# Patient Record
Sex: Female | Born: 1975 | Race: Black or African American | Hispanic: No | Marital: Single | Smoking: Current every day smoker
Health system: Southern US, Community
[De-identification: ages and names within clinical notes are randomized; demographics above are authoritative.]

## PROBLEM LIST (undated history)

## (undated) DIAGNOSIS — I10 Essential (primary) hypertension: Secondary | ICD-10-CM

## (undated) DIAGNOSIS — F4310 Post-traumatic stress disorder, unspecified: Secondary | ICD-10-CM

## (undated) DIAGNOSIS — E669 Obesity, unspecified: Secondary | ICD-10-CM

## (undated) DIAGNOSIS — D179 Benign lipomatous neoplasm, unspecified: Secondary | ICD-10-CM

## (undated) DIAGNOSIS — T8859XA Other complications of anesthesia, initial encounter: Secondary | ICD-10-CM

## (undated) DIAGNOSIS — D649 Anemia, unspecified: Secondary | ICD-10-CM

## (undated) DIAGNOSIS — T4145XA Adverse effect of unspecified anesthetic, initial encounter: Secondary | ICD-10-CM

## (undated) DIAGNOSIS — Z9289 Personal history of other medical treatment: Secondary | ICD-10-CM

## (undated) DIAGNOSIS — J40 Bronchitis, not specified as acute or chronic: Secondary | ICD-10-CM

## (undated) HISTORY — PX: COLON SURGERY: SHX602

## (undated) HISTORY — PX: APPENDECTOMY: SHX54

## (undated) HISTORY — PX: KNEE SURGERY: SHX244

## (undated) HISTORY — DX: Essential (primary) hypertension: I10

---

## 2014-02-05 ENCOUNTER — Encounter (HOSPITAL_COMMUNITY): Payer: Self-pay | Admitting: Emergency Medicine

## 2014-02-05 ENCOUNTER — Emergency Department (HOSPITAL_COMMUNITY)
Admission: EM | Admit: 2014-02-05 | Discharge: 2014-02-05 | Disposition: A | Discharge status: Home / Self Care | Payer: Medicare Other | Attending: Emergency Medicine | Admitting: Emergency Medicine

## 2014-02-05 ENCOUNTER — Emergency Department (HOSPITAL_COMMUNITY): Payer: Medicare Other

## 2014-02-05 DIAGNOSIS — Y929 Unspecified place or not applicable: Secondary | ICD-10-CM | POA: Insufficient documentation

## 2014-02-05 DIAGNOSIS — S99919A Unspecified injury of unspecified ankle, initial encounter: Principal | ICD-10-CM

## 2014-02-05 DIAGNOSIS — S8990XA Unspecified injury of unspecified lower leg, initial encounter: Secondary | ICD-10-CM | POA: Insufficient documentation

## 2014-02-05 DIAGNOSIS — F17200 Nicotine dependence, unspecified, uncomplicated: Secondary | ICD-10-CM | POA: Diagnosis not present

## 2014-02-05 DIAGNOSIS — X500XXA Overexertion from strenuous movement or load, initial encounter: Secondary | ICD-10-CM | POA: Insufficient documentation

## 2014-02-05 DIAGNOSIS — W108XXA Fall (on) (from) other stairs and steps, initial encounter: Secondary | ICD-10-CM | POA: Insufficient documentation

## 2014-02-05 DIAGNOSIS — Y9389 Activity, other specified: Secondary | ICD-10-CM | POA: Diagnosis not present

## 2014-02-05 DIAGNOSIS — M25571 Pain in right ankle and joints of right foot: Secondary | ICD-10-CM

## 2014-02-05 DIAGNOSIS — Z8659 Personal history of other mental and behavioral disorders: Secondary | ICD-10-CM | POA: Diagnosis not present

## 2014-02-05 DIAGNOSIS — S99929A Unspecified injury of unspecified foot, initial encounter: Principal | ICD-10-CM

## 2014-02-05 HISTORY — DX: Post-traumatic stress disorder, unspecified: F43.10

## 2014-02-05 MED ORDER — DICLOFENAC 35 MG PO CAPS: 1.0000 | ORAL_CAPSULE | Freq: Three times a day (TID) | ORAL | Status: DC | PRN

## 2014-02-05 MED ORDER — DICLOFENAC SODIUM 75 MG PO TBEC
75.0000 mg | DELAYED_RELEASE_TABLET | Freq: Once | ORAL | Status: AC
  Administered 2014-02-05: 75 mg via ORAL
  Filled 2014-02-05: qty 1

## 2014-02-05 MED ORDER — ACETAMINOPHEN-CODEINE #3 300-30 MG PO TABS: 1.0000 | ORAL_TABLET | Freq: Four times a day (QID) | ORAL | Status: DC | PRN

## 2014-02-05 NOTE — ED Notes (Signed)
Pt presents with pain and swelling to Right foot. Pt states she fell down her stairs and her foot went backwards

## 2014-02-05 NOTE — Progress Notes (Signed)
Orthopedic Tech Progress Note Patient Details:  Betty Perez 1976-10-14 [id number]  Ortho Devices Type of Ortho Device: Ace wrap;Crutches Ortho Device/Splint Location: RLE Ortho Device/Splint Interventions: Ordered;Application   Braulio Bosch 02/05/2014, 9:14 PM

## 2014-02-05 NOTE — ED Notes (Signed)
Ortho at bedside.

## 2014-02-05 NOTE — ED Notes (Signed)
Patient transported to X-ray 

## 2014-02-05 NOTE — Discharge Instructions (Signed)
Please follow up with your primary care physician in 1-2 days. If you do not have one please call the [facility] number listed above.Please follow up with the orthopedist, Dr. Percell Miller to schedule a follow up appointment. Please take Voltaren as prescribed. Please follow RICE method below. Please read all discharge instructions and return precautions.    Ankle Pain Ankle pain is a common symptom. The bones, cartilage, tendons, and muscles of the ankle joint perform a lot of work each day. The ankle joint holds your body weight and allows you to move around. Ankle pain can occur on either side or back of 1 or both ankles. Ankle pain may be sharp and burning or dull and aching. There may be tenderness, stiffness, redness, or warmth around the ankle. The pain occurs more often when a person walks or puts pressure on the ankle. CAUSES  There are many reasons ankle pain can develop. It is important to work with your caregiver to identify the cause since many conditions can impact the bones, cartilage, muscles, and tendons. Causes for ankle pain include:  Injury, including a break (fracture), sprain, or strain often due to a fall, sports, or a high-impact activity.  Swelling (inflammation) of a tendon (tendonitis).  Achilles tendon rupture.  Ankle instability after repeated sprains and strains.  Poor foot alignment.  Pressure on a nerve (tarsal tunnel syndrome).  Arthritis in the ankle or the lining of the ankle.  Crystal formation in the ankle (gout or pseudogout). DIAGNOSIS  A diagnosis is based on your medical history, your symptoms, results of your physical exam, and results of diagnostic tests. Diagnostic tests may include X-ray exams or a computerized magnetic scan (magnetic resonance imaging, MRI). TREATMENT  Treatment will depend on the cause of your ankle pain and may include:  Keeping pressure off the ankle and limiting activities.  Using crutches or other walking  support (a cane or brace).  Using rest, ice, compression, and elevation.  Participating in physical therapy or home exercises.  Wearing shoe inserts or special shoes.  Losing weight.  Taking medications to reduce pain or swelling or receiving an injection.  Undergoing surgery. HOME CARE INSTRUCTIONS   Only take over-the-counter or prescription medicines for pain, discomfort, or fever as directed by your caregiver.  Put ice on the injured area.  Put ice in a plastic bag.  Place a towel between your skin and the bag.  Leave the ice on for 15-20 minutes at a time, 03-04 times a day.  Keep your leg raised (elevated) when possible to lessen swelling.  Avoid activities that cause ankle pain.  Follow specific exercises as directed by your caregiver.  Record how often you have ankle pain, the location of the pain, and what it feels like. This information may be helpful to you and your caregiver.  Ask your caregiver about returning to work or sports and whether you should drive.  Follow up with your caregiver for further examination, therapy, or testing as directed. SEEK MEDICAL CARE IF:   Pain or swelling continues or worsens beyond 1 week.  You have an oral temperature above 102 F (38.9 C).  You are feeling unwell or have chills.  You are having an increasingly difficult time with walking.  You have loss of sensation or other new symptoms.  You have questions or concerns. MAKE SURE YOU:   Understand these instructions.  Will watch your condition.  Will get help right away if you are not doing  well or get worse. Document Released: 05/05/2010 Document Revised: 02/07/2012 Document Reviewed: 05/05/2010 [facility] Patient Information 2014 [place]. RICE: Routine Care for Injuries The routine care of many injuries includes Rest, Ice, Compression, and Elevation (RICE). HOME CARE INSTRUCTIONS  Rest is needed to allow your body to heal. Routine activities can  usually be resumed when comfortable. Injured tendons and bones can take up to 6 weeks to heal. Tendons are the cord-like structures that attach muscle to bone.  Ice following an injury helps keep the swelling down and reduces pain.  Put ice in a plastic bag.  Place a towel between your skin and the bag.  Leave the ice on for 15-20 minutes, 03-04 times a day. Do this while awake, for the first 24 to 48 hours. After that, continue as directed by your caregiver.  Compression helps keep swelling down. It also gives support and helps with discomfort. If an elastic bandage has been applied, it should be removed and reapplied every 3 to 4 hours. It should not be applied tightly, but firmly enough to keep swelling down. Watch fingers or toes for swelling, bluish discoloration, coldness, numbness, or excessive pain. If any of these problems occur, remove the bandage and reapply loosely. Contact your caregiver if these problems continue.  Elevation helps reduce swelling and decreases pain. With extremities, such as the arms, hands, legs, and feet, the injured area should be placed near or above the level of the heart, if possible. SEEK IMMEDIATE MEDICAL CARE IF:  You have persistent pain and swelling.  You develop redness, numbness, or unexpected weakness.  Your symptoms are getting worse rather than improving after several days. These symptoms may indicate that further evaluation or further X-rays are needed. Sometimes, X-rays may not show a small broken bone (fracture) until 1 week or 10 days later. Make a follow-up appointment with your caregiver. Ask when your X-ray results will be ready. Make sure you get your X-ray results. Document Released: 02/27/2001 Document Revised: 02/07/2012 Document Reviewed: 04/16/2011 [facility] Patient Information 2014 [place], [place].

## 2014-02-05 NOTE — ED Notes (Signed)
Pt returned from xray

## 2014-02-05 NOTE — ED Provider Notes (Signed)
CSN: [id number]     Arrival date & time 02/05/14  1848 History   First MD Initiated Contact with Patient 02/05/14 1956 This chart was scribed for non-physician practitioner Baron Sane, PA-C working with Juanda Crumble B. Karle Starch, MD by Anastasia Pall, ED scribe. This patient was seen in room TR11C/TR11C and the patient's care was started at 8:35 PM.     Chief Complaint  Patient presents with  . Foot Injury   (Consider location/radiation/quality/duration/timing/severity/associated sxs/prior Treatment) The history is provided by the patient. No language interpreter was used.   HPI Comments: Betty Perez is a 38 y.o. female who presents to the Emergency Department complaining of constant, sharp, stabbing, right foot pain, with immediate associated swelling, onset earlier this evening when she fell and landed on her foot bending it backwards. She states the pain radiates up her right calf. She also reports associated tingling in her right toes. She reports h/o right foot fx from a fall. She denies any other associated symptoms.   PCP - No PCP Per Patient  Past Medical History  Diagnosis Date  . PTSD (post-traumatic stress disorder)    Past Surgical History  Procedure Laterality Date  . Appendectomy    . Colon surgery    . Back surgery    . Neck surgery    . Knee surgery     History reviewed. No pertinent family history. History  Substance Use Topics  . Smoking status: Current Every Day Smoker  . Smokeless tobacco: Not on file  . Alcohol Use: No   OB History   Grav Para Term Preterm Abortions TAB SAB Ect Mult Living                 Review of Systems  Constitutional: Negative for fever.  Musculoskeletal: Positive for arthralgias (right foot) and joint swelling.  Skin: Negative for wound.  Neurological: Negative for weakness and numbness.  All other systems reviewed and are negative.    Allergies  Review of patient's allergies indicates no known allergies.  Home  Medications   Current Outpatient Rx  Name  Route  Sig  Dispense  Refill  . acetaminophen-codeine (TYLENOL #3) 300-30 MG per tablet   Oral   Take 1-2 tablets by mouth every 6 (six) hours as needed for severe pain.   10 tablet   0   . Diclofenac 35 MG CAPS   Oral   Take 1 tablet by mouth 3 (three) times daily as needed (pain).   30 capsule   0     BP 120/76  Pulse 94  Temp(Src) 99 F (37.2 C) (Oral)  Resp 15  SpO2 100%  LMP 02/03/2014  Physical Exam  Constitutional: She is oriented to person, place, and time. She appears well-developed and well-nourished. No distress.  HENT:  Head: Normocephalic and atraumatic.  Right Ear: External ear normal.  Left Ear: External ear normal.  Nose: Nose normal.  Mouth/Throat: Oropharynx is clear and moist.  Eyes: Conjunctivae are normal.  Neck: Normal range of motion. Neck supple.  Cardiovascular: Normal rate and intact distal pulses.   Pulmonary/Chest: Effort normal.  Abdominal: Soft.  Musculoskeletal:       Right knee: Normal.       Left knee: Normal.       Right ankle: She exhibits decreased range of motion and swelling. She exhibits no ecchymosis, no deformity, no laceration and normal pulse. Tenderness. Lateral malleolus and medial malleolus tenderness found. No AITFL, no CF ligament, no posterior TFL, no head  of 5th metatarsal and no proximal fibula tenderness found. Achilles tendon normal.       Left ankle: Normal.       Right lower leg: Normal.       Left lower leg: Normal.       Right foot: Normal.       Left foot: Normal.  Neurological: She is alert and oriented to person, place, and time. No sensory deficit. GCS eye subscore is 4. GCS verbal subscore is 5. GCS motor subscore is 6.  Skin: Skin is warm and dry. She is not diaphoretic.  Psychiatric: She has a normal mood and affect.    ED Course  Procedures (including critical care time)  DIAGNOSTIC STUDIES: Oxygen Saturation is 100% on room air, normal by my  interpretation.    COORDINATION OF CARE: 8:38 PM - Discussed negative imaging results with pt. Discussed treatment plan with pt at bedside and pt agreed to plan.   No results found for this or any previous visit. Dg Ankle Complete Right  02/05/2014   CLINICAL DATA Ankle pain  EXAM RIGHT ANKLE - COMPLETE 3+ VIEW  COMPARISON None.  FINDINGS There is no acute fracture or dislocation. There is an old healed distal fibular fracture. There are well corticated os ossific fragments anterior to the tibiotalar joint likely represents sequela of prior trauma. There is an off ossific fragment distal to the fibula with overlying soft tissue swelling which may reflect sequela of avulsive injury.  The ankle mortise is intact.  IMPRESSION There is an ossific fragment distal to the fibula with overlying soft tissue swelling which may reflect sequela of avulsive injury.  SIGNATURE  Electronically Signed   By: Kathreen Devoid   On: 02/05/2014 20:25   Dg Foot Complete Right  02/05/2014   CLINICAL DATA Foot and ankle pain  EXAM RIGHT FOOT COMPLETE - 3+ VIEW  COMPARISON None.  FINDINGS There is no acute fracture or dislocation. There is an ossific fragment distal to the fibula likely representing sequela of an avulsive injury. There is generalized soft tissue swelling around the right ankle particularly over the lateral malleolus.  IMPRESSION There is an ossific fragment distal to the fibula likely representing sequela of an avulsive injury. There is generalized soft tissue swelling around the right ankle particularly over the lateral malleolus.  There is no osseous injury of the right foot.  SIGNATURE  Electronically Signed   By: Kathreen Devoid   On: 02/05/2014 20:26    EKG Interpretation None     Medications  diclofenac (VOLTAREN) EC tablet 75 mg (75 mg Oral Given 02/05/14 2056)    MDM   Final diagnoses:  Right ankle pain    Filed Vitals:   02/05/14 2125  BP: 115/52  Pulse: 72  Temp:   Resp: 20    Afebrile,  NAD, non-toxic appearing, AAOx4. Neurovascularly intact. Normal sensation.  Imaging shows no acute fracture. Directed pt to ice injury, take acetaminophen or ibuprofen for pain, and to elevate and rest the injury when possible. Splinted ankle for support and comfort. Provided crutches. RICE method discussed. Advised PCP f/u. Return precautions discussed. Patient is agreeable to plan. Patient is stable at time of discharge    I personally performed the services described in this documentation, which was scribed in my presence. The recorded information has been reviewed and is accurate.     Harlow Mares, PA-C 02/06/14 0019

## 2014-02-05 NOTE — ED Notes (Signed)
Ortho paged. 

## 2014-02-06 NOTE — ED Provider Notes (Signed)
Medical screening examination/treatment/procedure(s) were performed by non-physician practitioner and as supervising physician I was immediately available for consultation/collaboration.   EKG Interpretation None        Charles B. Karle Starch, MD 02/06/14 1018

## 2014-05-15 ENCOUNTER — Emergency Department (HOSPITAL_COMMUNITY)
Admission: EM | Admit: 2014-05-15 | Discharge: 2014-05-15 | Disposition: A | Discharge status: Home / Self Care | Payer: Medicare Other | Attending: Emergency Medicine | Admitting: Emergency Medicine

## 2014-05-15 ENCOUNTER — Encounter (HOSPITAL_COMMUNITY): Payer: Self-pay | Admitting: Emergency Medicine

## 2014-05-15 DIAGNOSIS — IMO0002 Reserved for concepts with insufficient information to code with codable children: Secondary | ICD-10-CM | POA: Diagnosis present

## 2014-05-15 DIAGNOSIS — Z8709 Personal history of other diseases of the respiratory system: Secondary | ICD-10-CM | POA: Insufficient documentation

## 2014-05-15 DIAGNOSIS — Z8659 Personal history of other mental and behavioral disorders: Secondary | ICD-10-CM | POA: Diagnosis not present

## 2014-05-15 DIAGNOSIS — F17200 Nicotine dependence, unspecified, uncomplicated: Secondary | ICD-10-CM | POA: Insufficient documentation

## 2014-05-15 HISTORY — DX: Obesity, unspecified: E66.9

## 2014-05-15 HISTORY — DX: Bronchitis, not specified as acute or chronic: J40

## 2014-05-15 NOTE — ED Notes (Signed)
Pt asked how many people were ahead of her to go to treatment room.  Pt states she is leaving due to wait and will return in the morning.  Encouraged pt to stay and pt declined.

## 2014-05-15 NOTE — ED Notes (Signed)
Pt. reports abscess at left axilla with swelling and drainage onset Sunday this week , denies fever or chills.

## 2015-01-08 ENCOUNTER — Encounter (HOSPITAL_COMMUNITY): Payer: Self-pay

## 2015-01-08 ENCOUNTER — Emergency Department (HOSPITAL_COMMUNITY)
Admission: EM | Admit: 2015-01-08 | Discharge: 2015-01-08 | Disposition: A | Discharge status: Home / Self Care | Payer: Medicare Other | Attending: Emergency Medicine | Admitting: Emergency Medicine

## 2015-01-08 DIAGNOSIS — M79601 Pain in right arm: Secondary | ICD-10-CM | POA: Diagnosis present

## 2015-01-08 DIAGNOSIS — Z8709 Personal history of other diseases of the respiratory system: Secondary | ICD-10-CM | POA: Diagnosis not present

## 2015-01-08 DIAGNOSIS — Z720 Tobacco use: Secondary | ICD-10-CM | POA: Insufficient documentation

## 2015-01-08 DIAGNOSIS — R000 Tachycardia, unspecified: Secondary | ICD-10-CM | POA: Diagnosis not present

## 2015-01-08 DIAGNOSIS — Z8659 Personal history of other mental and behavioral disorders: Secondary | ICD-10-CM | POA: Diagnosis not present

## 2015-01-08 DIAGNOSIS — M7989 Other specified soft tissue disorders: Secondary | ICD-10-CM

## 2015-01-08 DIAGNOSIS — E669 Obesity, unspecified: Secondary | ICD-10-CM | POA: Insufficient documentation

## 2015-01-08 DIAGNOSIS — Z79899 Other long term (current) drug therapy: Secondary | ICD-10-CM | POA: Diagnosis not present

## 2015-01-08 DIAGNOSIS — L03113 Cellulitis of right upper limb: Secondary | ICD-10-CM | POA: Diagnosis not present

## 2015-01-08 DIAGNOSIS — L02413 Cutaneous abscess of right upper limb: Secondary | ICD-10-CM | POA: Diagnosis not present

## 2015-01-08 LAB — BASIC METABOLIC PANEL
Anion gap: 7 (ref 5–15)
BUN: 7 mg/dL (ref 6–23)
CO2: 25 mmol/L (ref 19–32)
Calcium: 9.1 mg/dL (ref 8.4–10.5)
Chloride: 107 mmol/L (ref 96–112)
Creatinine, Ser: 0.76 mg/dL (ref 0.50–1.10)
GFR calc Af Amer: >90 mL/min (ref >90)
GFR calc non Af Amer: >90 mL/min (ref >90)
GLUCOSE: 93 mg/dL (ref 70–99)
POTASSIUM: 3.6 mmol/L (ref 3.5–5.1)
Sodium: 139 mmol/L (ref 135–145)

## 2015-01-08 LAB — CBC
HCT: 38.4 % (ref 36.0–46.0)
HEMOGLOBIN: 12.7 g/dL (ref 12.0–15.0)
MCH: 31.5 pg (ref 26.0–34.0)
MCHC: 33.1 g/dL (ref 30.0–36.0)
MCV: 95.3 fL (ref 78.0–100.0)
Platelets: 208 10*3/uL (ref 150–400)
RBC: 4.03 MIL/uL (ref 3.87–5.11)
RDW: 15.1 % (ref 11.5–15.5)
WBC: 12.5 10*3/uL — ABNORMAL HIGH (ref 4.0–10.5)

## 2015-01-08 LAB — PROTIME-INR
INR: 1.22 (ref 0.00–1.49)
PROTHROMBIN TIME: 15.6 s — AB (ref 11.6–15.2)

## 2015-01-08 MED ORDER — HYDROCODONE-ACETAMINOPHEN 5-325 MG PO TABS: 1.0000 | ORAL_TABLET | ORAL | Status: DC | PRN

## 2015-01-08 MED ORDER — LIDOCAINE-EPINEPHRINE 2 %-1:100000 IJ SOLN
20.0000 mL | Freq: Once | INTRAMUSCULAR | Status: DC
  Filled 2015-01-08: qty 20

## 2015-01-08 MED ORDER — SULFAMETHOXAZOLE-TRIMETHOPRIM 800-160 MG PO TABS: 1.0000 | ORAL_TABLET | Freq: Two times a day (BID) | ORAL | Status: AC

## 2015-01-08 MED ORDER — OXYCODONE-ACETAMINOPHEN 5-325 MG PO TABS
2.0000 | ORAL_TABLET | Freq: Once | ORAL | Status: AC
  Administered 2015-01-08: 2 via ORAL
  Filled 2015-01-08: qty 2

## 2015-01-08 MED ORDER — LIDOCAINE-EPINEPHRINE (PF) 2 %-1:200000 IJ SOLN
20.0000 mL | Freq: Once | INTRAMUSCULAR | Status: AC
  Administered 2015-01-08: 20 mL
  Filled 2015-01-08: qty 20
  Filled 2015-01-08: qty 40

## 2015-01-08 MED ORDER — LIDOCAINE-EPINEPHRINE 2 %-1:100000 IJ SOLN: 10.0000 mL | Freq: Once | INTRAMUSCULAR | Status: DC

## 2015-01-08 NOTE — ED Notes (Signed)
Abscess site marked around it.

## 2015-01-08 NOTE — Discharge Instructions (Signed)
Take Vicodin for severe pain only. No driving or operating heavy machinery while taking vicodin. This medication may cause drowsiness. Take bactrim twice daily for 1 week. Apply warm compresses intermittently throughout the day. Change the dressing twice daily. Return if the redness spreads beyond the skin markings drawn.  Abscess An abscess is an infected area that contains a collection of pus and debris.It can occur in almost any part of the body. An abscess is also known as a furuncle or boil. CAUSES  An abscess occurs when tissue gets infected. This can occur from blockage of oil or sweat glands, infection of hair follicles, or a minor injury to the skin. As the body tries to fight the infection, pus collects in the area and creates pressure under the skin. This pressure causes pain. People with weakened immune systems have difficulty fighting infections and get certain abscesses more often.  SYMPTOMS Usually an abscess develops on the skin and becomes a painful mass that is red, warm, and tender. If the abscess forms under the skin, you may feel a moveable soft area under the skin. Some abscesses break open (rupture) on their own, but most will continue to get worse without care. The infection can spread deeper into the body and eventually into the bloodstream, causing you to feel ill.  DIAGNOSIS  Your caregiver will take your medical history and perform a physical exam. A sample of fluid may also be taken from the abscess to determine what is causing your infection. TREATMENT  Your caregiver may prescribe antibiotic medicines to fight the infection. However, taking antibiotics alone usually does not cure an abscess. Your caregiver may need to make a small cut (incision) in the abscess to drain the pus. In some cases, gauze is packed into the abscess to reduce pain and to continue draining the area. HOME CARE INSTRUCTIONS   Only take over-the-counter or prescription medicines for pain,  discomfort, or fever as directed by your caregiver.  If you were prescribed antibiotics, take them as directed. Finish them even if you start to feel better.  If gauze is used, follow your caregiver's directions for changing the gauze.  To avoid spreading the infection:  Keep your draining abscess covered with a bandage.  Wash your hands well.  Do not share personal care items, towels, or whirlpools with others.  Avoid skin contact with others.  Keep your skin and clothes clean around the abscess.  Keep all follow-up appointments as directed by your caregiver. SEEK MEDICAL CARE IF:   You have increased pain, swelling, redness, fluid drainage, or bleeding.  You have muscle aches, chills, or a general ill feeling.  You have a fever. MAKE SURE YOU:   Understand these instructions.  Will watch your condition.  Will get help right away if you are not doing well or get worse. Document Released: 08/25/2005 Document Revised: 05/16/2012 Document Reviewed: 01/28/2012 [facility] Patient Information 2015 [place], [place]. This information is not intended to replace advice given to you by your health care provider. Make sure you discuss any questions you have with your health care provider.  Abscess Care After An abscess (also called a boil or furuncle) is an infected area that contains a collection of pus. Signs and symptoms of an abscess include pain, tenderness, redness, or hardness, or you may feel a moveable soft area under your skin. An abscess can occur anywhere in the body. The infection may spread to surrounding tissues causing cellulitis. A cut (incision) by the surgeon was made  over your abscess and the pus was drained out. Gauze may have been packed into the space to provide a drain that will allow the cavity to heal from the inside outwards. The boil may be painful for 5 to 7 days. Most people with a boil do not have high fevers. Your abscess, if seen early, may not have localized,  and may not have been lanced. If not, another appointment may be required for this if it does not get better on its own or with medications. HOME CARE INSTRUCTIONS   Only take over-the-counter or prescription medicines for pain, discomfort, or fever as directed by your caregiver.  When you bathe, soak and then remove gauze or iodoform packs at least daily or as directed by your caregiver. You may then wash the wound gently with mild soapy water. Repack with gauze or do as your caregiver directs. SEEK IMMEDIATE MEDICAL CARE IF:   You develop increased pain, swelling, redness, drainage, or bleeding in the wound site.  You develop signs of generalized infection including muscle aches, chills, fever, or a general ill feeling.  An oral temperature above 102 F (38.9 C) develops, not controlled by medication. See your caregiver for a recheck if you develop any of the symptoms described above. If medications (antibiotics) were prescribed, take them as directed. Document Released: 06/03/2005 Document Revised: 02/07/2012 Document Reviewed: 01/29/2008 [facility] Patient Information 2015 [place], [place]. This information is not intended to replace advice given to you by your health care provider. Make sure you discuss any questions you have with your health care provider.  Cellulitis Cellulitis is an infection of the skin and the tissue beneath it. The infected area is usually red and tender. Cellulitis occurs most often in the arms and lower legs.  CAUSES  Cellulitis is caused by bacteria that enter the skin through cracks or cuts in the skin. The most common types of bacteria that cause cellulitis are staphylococci and streptococci. SIGNS AND SYMPTOMS   Redness and warmth.  Swelling.  Tenderness or pain.  Fever. DIAGNOSIS  Your health care provider can usually determine what is wrong based on a physical exam. Blood tests may also be done. TREATMENT  Treatment usually involves taking an  antibiotic medicine. HOME CARE INSTRUCTIONS   Take your antibiotic medicine as directed by your health care provider. Finish the antibiotic even if you start to feel better.  Keep the infected arm or leg elevated to reduce swelling.  Apply a warm cloth to the affected area up to 4 times per day to relieve pain.  Take medicines only as directed by your health care provider.  Keep all follow-up visits as directed by your health care provider. SEEK MEDICAL CARE IF:   You notice red streaks coming from the infected area.  Your red area gets larger or turns dark in color.  Your bone or joint underneath the infected area becomes painful after the skin has healed.  Your infection returns in the same area or another area.  You notice a swollen bump in the infected area.  You develop new symptoms.  You have a fever. SEEK IMMEDIATE MEDICAL CARE IF:   You feel very sleepy.  You develop vomiting or diarrhea.  You have a general ill feeling (malaise) with muscle aches and pains. MAKE SURE YOU:   Understand these instructions.  Will watch your condition.  Will get help right away if you are not doing well or get worse. Document Released: 08/25/2005 Document Revised: 04/01/2014 Document Reviewed:  01/31/2012 [employer] Patient Information 2015 [place], [place]. This information is not intended to replace advice given to you by your health care provider. Make sure you discuss any questions you have with your health care provider.

## 2015-01-08 NOTE — ED Notes (Signed)
Pt with right arm pain that started last Thursday.  Sts she was carrying some groceries when she felt her arm pop.  Redness and swelling to right arm.  Firm to touch.

## 2015-01-08 NOTE — ED Provider Notes (Signed)
CSN: [id number]     Arrival date & time 01/08/15  1348 History   First MD Initiated Contact with Patient 01/08/15 1551     Chief Complaint  Patient presents with  . Arm Pain     (Consider location/radiation/quality/duration/timing/severity/associated sxs/prior Treatment) HPI Comments: 39 year old female presenting with right arm pain 3 days. Patient reports she was carrying groceries when she felt her arm "pop", and gradually noticed a red, raised, very tender area started to develop. Pain radiates through the vein of her right upper arm and is very sensitive, worse with movement. She has tried taking over-the-counter pain relievers with no relief. Admits to subjective fevers. Denies chest pain or shortness of breath. No history of blood clots. She is a smoker.  Patient is a 39 y.o. female presenting with arm pain. The history is provided by the patient.  Arm Pain Associated symptoms include a fever.    Past Medical History  Diagnosis Date  . PTSD (post-traumatic stress disorder)   . Bronchitis   . Obesity    Past Surgical History  Procedure Laterality Date  . Appendectomy    . Colon surgery    . Back surgery    . Neck surgery    . Knee surgery     History reviewed. No pertinent family history. History  Substance Use Topics  . Smoking status: Current Every Day Smoker  . Smokeless tobacco: Not on file  . Alcohol Use: No   OB History    No data available     Review of Systems  Constitutional: Positive for fever.  Musculoskeletal:       + R arm pain and swelling.  Skin: Positive for color change.  All other systems reviewed and are negative.     Allergies  Review of patient's allergies indicates no known allergies.  Home Medications   Prior to Admission medications   Medication Sig Start Date End Date Taking? Authorizing Provider  Colloidal Oatmeal 2 % CREA Apply 1 application topically daily.   Yes Historical Provider, MD  diphenhydramine-acetaminophen  (TYLENOL PM) 25-500 MG TABS Take 4 tablets by mouth at bedtime as needed (pain).   Yes Historical Provider, MD  HYDROcodone-acetaminophen (NORCO/VICODIN) 5-325 MG per tablet Take 1-2 tablets by mouth every 4 (four) hours as needed. 01/08/15   Carman Ching, PA-C  sulfamethoxazole-trimethoprim (BACTRIM DS,SEPTRA DS) 800-160 MG per tablet Take 1 tablet by mouth 2 (two) times daily. 01/08/15 01/15/15  Rogers Ditter M Shriyan Arakawa, PA-C   BP 122/67 mmHg  Pulse 85  Temp(Src) 97.9 F (36.6 C) (Oral)  Resp 18  SpO2 100% Physical Exam  Constitutional: She is oriented to person, place, and time. She appears well-developed and well-nourished. No distress.  HENT:  Head: Normocephalic and atraumatic.  Mouth/Throat: Oropharynx is clear and moist.  Eyes: Conjunctivae and EOM are normal.  Neck: Normal range of motion. Neck supple.  Cardiovascular: Regular rhythm, normal heart sounds and intact distal pulses.   Mild tachy.  Pulmonary/Chest: Effort normal and breath sounds normal. No respiratory distress.  Musculoskeletal:       Arms: Neurological: She is alert and oriented to person, place, and time. No sensory deficit.  Skin: Skin is warm and dry.  Psychiatric: She has a normal mood and affect. Her behavior is normal.  Nursing note and vitals reviewed.   ED Course  Procedures (including critical care time) INCISION AND DRAINAGE Performed by: Lucien Mons Consent: Verbal consent obtained. Risks and benefits: risks, benefits and alternatives were discussed Type: abscess  Body area: right arm  Anesthesia: local infiltration  Incision was made with a scalpel.  Local anesthetic: lidocaine 2% with epinephrine  Anesthetic total: 2 ml  Complexity: complex Blunt dissection to break up loculations  Drainage: purulent  Drainage amount: medium  Packing material: 1/4 in iodoform gauze  Patient tolerance: Patient tolerated the procedure well with no immediate complications.    Labs Review Labs Reviewed    CBC - Abnormal; Notable for the following:    WBC 12.5 (*)    All other components within normal limits  PROTIME-INR - Abnormal; Notable for the following:    Prothrombin Time 15.6 (*)    All other components within normal limits  BASIC METABOLIC PANEL    Imaging Review No results found.   EKG Interpretation None      MDM   Final diagnoses:  Abscess of right arm  Cellulitis of right upper extremity   NAD. Mild tachy, vitals otherwise stable. Afebrile. DVT vs abscess with cellulitis. Labs, UE venous duplex pending.  UE venous duplex negative for DVT. Bedside ultrasound performed by Dr. Venora Maples and I, visualized area of abscess to ensure no close proximity to large artery/vein. Abscess drained. Purulent drainage. Will start on abx given cellulitis. Skin markings drawn. HR improved with pain medication. Stable for d/c. Return precautions given. Patient states understanding of treatment care plan and is agreeable.  Discussed with attending Dr. Venora Maples who also evaluated patient and agrees with plan of care.  Carman Ching, PA-C 01/08/15 [place], PA-C 01/08/15 [place], MD 01/08/15 [place]

## 2015-01-08 NOTE — ED Notes (Signed)
Pt reports carrying some groceries and heard a popping sound in R arm. Since incident, pt has had increased pain in arm. Pain increases on palpation. Reports taking otc tylenol without relief.

## 2015-01-08 NOTE — Progress Notes (Signed)
VASCULAR LAB PRELIMINARY  PRELIMINARY  PRELIMINARY  PRELIMINARY  Right upper extremity venous duplex completed.    Preliminary report:  Right :  No obvious evidence of DVT or superficial thrombosis.    Heiley Shaikh, RVT 01/08/2015, 6:38 PM
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
## 2015-11-11 ENCOUNTER — Encounter (HOSPITAL_COMMUNITY): Payer: Self-pay

## 2015-11-11 ENCOUNTER — Emergency Department (HOSPITAL_COMMUNITY)
Admission: EM | Admit: 2015-11-11 | Discharge: 2015-11-11 | Disposition: A | Discharge status: Home / Self Care | Payer: Medicare Other | Attending: Emergency Medicine | Admitting: Emergency Medicine

## 2015-11-11 DIAGNOSIS — Z3202 Encounter for pregnancy test, result negative: Secondary | ICD-10-CM | POA: Diagnosis not present

## 2015-11-11 DIAGNOSIS — N946 Dysmenorrhea, unspecified: Secondary | ICD-10-CM | POA: Diagnosis not present

## 2015-11-11 DIAGNOSIS — F17200 Nicotine dependence, unspecified, uncomplicated: Secondary | ICD-10-CM | POA: Insufficient documentation

## 2015-11-11 DIAGNOSIS — Z8659 Personal history of other mental and behavioral disorders: Secondary | ICD-10-CM | POA: Diagnosis not present

## 2015-11-11 DIAGNOSIS — Z8709 Personal history of other diseases of the respiratory system: Secondary | ICD-10-CM | POA: Insufficient documentation

## 2015-11-11 DIAGNOSIS — R102 Pelvic and perineal pain: Secondary | ICD-10-CM | POA: Diagnosis present

## 2015-11-11 DIAGNOSIS — E669 Obesity, unspecified: Secondary | ICD-10-CM | POA: Insufficient documentation

## 2015-11-11 DIAGNOSIS — M79651 Pain in right thigh: Secondary | ICD-10-CM

## 2015-11-11 DIAGNOSIS — R1030 Lower abdominal pain, unspecified: Secondary | ICD-10-CM

## 2015-11-11 LAB — BASIC METABOLIC PANEL
ANION GAP: 6 (ref 5–15)
BUN: 13 mg/dL (ref 6–20)
CO2: 24 mmol/L (ref 22–32)
Calcium: 8.8 mg/dL — ABNORMAL LOW (ref 8.9–10.3)
Chloride: 111 mmol/L (ref 101–111)
Creatinine, Ser: 0.89 mg/dL (ref 0.44–1.00)
GLUCOSE: 99 mg/dL (ref 65–99)
POTASSIUM: 4 mmol/L (ref 3.5–5.1)
Sodium: 141 mmol/L (ref 135–145)

## 2015-11-11 LAB — URINALYSIS, ROUTINE W REFLEX MICROSCOPIC
Bilirubin Urine: NEGATIVE
GLUCOSE, UA: NEGATIVE mg/dL
Ketones, ur: 15 mg/dL — AB
Leukocytes, UA: NEGATIVE
Nitrite: NEGATIVE
PH: 6 (ref 5.0–8.0)
Protein, ur: NEGATIVE mg/dL
Specific Gravity, Urine: 1.031 — ABNORMAL HIGH (ref 1.005–1.030)

## 2015-11-11 LAB — POC URINE PREG, ED: Preg Test, Ur: NEGATIVE

## 2015-11-11 LAB — CBC WITH DIFFERENTIAL/PLATELET
BASOS ABS: 0 10*3/uL (ref 0.0–0.1)
BASOS PCT: 0 %
Eosinophils Absolute: 0.1 10*3/uL (ref 0.0–0.7)
Eosinophils Relative: 1 %
HEMATOCRIT: 38.3 % (ref 36.0–46.0)
Hemoglobin: 12.6 g/dL (ref 12.0–15.0)
LYMPHS PCT: 30 %
Lymphs Abs: 2.8 10*3/uL (ref 0.7–4.0)
MCH: 32.5 pg (ref 26.0–34.0)
MCHC: 32.9 g/dL (ref 30.0–36.0)
MCV: 98.7 fL (ref 78.0–100.0)
MONOS PCT: 10 %
Monocytes Absolute: 0.9 10*3/uL (ref 0.1–1.0)
NEUTROS ABS: 5.4 10*3/uL (ref 1.7–7.7)
Neutrophils Relative %: 59 %
Platelets: 195 10*3/uL (ref 150–400)
RBC: 3.88 MIL/uL (ref 3.87–5.11)
RDW: 15.8 % — ABNORMAL HIGH (ref 11.5–15.5)
WBC: 9.2 10*3/uL (ref 4.0–10.5)

## 2015-11-11 LAB — URINE MICROSCOPIC-ADD ON: WBC, UA: NONE SEEN WBC/hpf (ref 0–5)

## 2015-11-11 MED ORDER — HYDROCODONE-ACETAMINOPHEN 5-325 MG PO TABS: 1.0000 | ORAL_TABLET | Freq: Four times a day (QID) | ORAL | Status: DC | PRN

## 2015-11-11 MED ORDER — HYDROMORPHONE HCL 1 MG/ML IJ SOLN
1.0000 mg | Freq: Once | INTRAMUSCULAR | Status: AC
  Administered 2015-11-11: 1 mg via INTRAMUSCULAR
  Filled 2015-11-11: qty 1

## 2015-11-11 MED ORDER — NAPROXEN 500 MG PO TABS: 500.0000 mg | ORAL_TABLET | Freq: Two times a day (BID) | ORAL | Status: DC

## 2015-11-11 MED ORDER — KETOROLAC TROMETHAMINE 30 MG/ML IJ SOLN
30.0000 mg | Freq: Once | INTRAMUSCULAR | Status: AC
  Administered 2015-11-11: 30 mg via INTRAMUSCULAR
  Filled 2015-11-11: qty 1

## 2015-11-11 NOTE — Discharge Instructions (Signed)
As discussed, your evaluation today has been largely reassuring.  But, it is important that you monitor your condition carefully, and do not hesitate to return to the ED if you develop new, or concerning changes in your condition.  It is important to follow-up with both our gynecology colleagues, and the orthopedists.

## 2015-11-11 NOTE — ED Provider Notes (Signed)
CSN: [id number]     Arrival date & time 11/11/15  1701 History   First MD Initiated Contact with Patient 11/11/15 2032     Chief Complaint  Patient presents with  . Pelvic Pain     (Consider location/radiation/quality/duration/timing/severity/associated sxs/prior Treatment) HPI Patient presents with concern of lower abdominal pain, abnormal vaginal bleeding. Symptoms have been present for"a long time".  Symptoms consist of bilateral lower abdominal soreness, discomfort. Pain is minimally improved with OTC medication. Pain does not radiate superiorly, and there is no suprapubic pain. There is no ongoing dysuria, hematuria changes in frequency. Patient has had episodic vaginal bleeding for the past 2 months, and is currently having mild production. No vaginal discharge. Patient denies pregnancy. Patient has obstetric care in [place], moved here one year ago, has no local or discharge. Patient presents today due to frustration with the ongoing pain, no acute changes. Patient also has concern for right thigh swelling. This is also present for months, is unchanged. No distal loss of sensation or weakness.  Past Medical History  Diagnosis Date  . PTSD (post-traumatic stress disorder)   . Bronchitis   . Obesity    Past Surgical History  Procedure Laterality Date  . Appendectomy    . Colon surgery    . Back surgery    . Neck surgery    . Knee surgery     No family history on file. Social History  Substance Use Topics  . Smoking status: Current Every Day Smoker  . Smokeless tobacco: None  . Alcohol Use: No   OB History    No data available     Review of Systems  Constitutional:       Per HPI, otherwise negative  HENT:       Per HPI, otherwise negative  Respiratory:       Per HPI, otherwise negative  Cardiovascular:       Per HPI, otherwise negative  Gastrointestinal: Negative for vomiting.  Endocrine:       Negative aside from HPI  Genitourinary:       Neg  aside from HPI   Musculoskeletal:       Per HPI, otherwise negative  Skin: Negative.   Neurological: Negative for syncope.      Allergies  Review of patient's allergies indicates no known allergies.  Home Medications   Prior to Admission medications   Medication Sig Start Date End Date Taking? Authorizing Provider  diphenhydramine-acetaminophen (TYLENOL PM) 25-500 MG TABS Take 4 tablets by mouth at bedtime as needed (pain).   Yes Historical Provider, MD  naproxen (NAPROSYN) 500 MG tablet Take 500 mg by mouth daily as needed.   Yes Historical Provider, MD  HYDROcodone-acetaminophen (NORCO/VICODIN) 5-325 MG per tablet Take 1-2 tablets by mouth every 4 (four) hours as needed. Patient not taking: Reported on 11/11/2015 01/08/15   Hessie Diener Hess, PA-C   BP 123/84 mmHg  Pulse 80  Temp(Src) 98.9 F (37.2 C) (Oral)  Resp 19  SpO2 99%  LMP 11/11/2015 Physical Exam  Constitutional: She is oriented to person, place, and time. She appears well-developed and well-nourished. No distress.  Obese female sitting upright in bed, awake, alert, in no distress  HENT:  Head: Normocephalic and atraumatic.  Eyes: Conjunctivae and EOM are normal.  Cardiovascular: Normal rate and regular rhythm.   Pulmonary/Chest: Effort normal and breath sounds normal. No stridor. No respiratory distress.  Abdominal: She exhibits no distension.    Musculoskeletal: She exhibits no edema.  Legs: Neurological: She is alert and oriented to person, place, and time. No cranial nerve deficit.  Skin: Skin is warm and dry.  Psychiatric: She has a normal mood and affect.  Nursing note and vitals reviewed.   ED Course  Procedures (including critical care time) Labs Review Labs Reviewed  BASIC METABOLIC PANEL - Abnormal; Notable for the following:    Calcium 8.8 (*)    All other components within normal limits  CBC WITH DIFFERENTIAL/PLATELET - Abnormal; Notable for the following:    RDW 15.8 (*)    All other  components within normal limits  URINALYSIS, ROUTINE W REFLEX MICROSCOPIC (NOT AT [facility])  POC URINE PREG, ED   On repeat exam patient is in no distress. We discussed all findings at length, and the reassuring values. Given the months of symptoms, the patient will follow-up with our obstetrics team for further evaluation of fibroids versus dysmenorrhea.   MDM  She presents ongoing bilateral lower abdominal pain for months, no acute changes. Here, the patient is awake, alert, afebrile, no evidence for peritonitis, hemorrhage. Given the persistent bleeding, concern for dysmenorrhea, patient will follow up with our gynecology colleagues. Absent acute changes, per her report, pelvic exam not performed. Patient did receive analgesia, with relief here.    Carmin Muskrat, MD 11/11/15 2312

## 2015-11-11 NOTE — ED Notes (Signed)
Pt here with c/o pelvic pain ongoing "for a while" associated with abnormal vaginal bleeding.

## 2015-12-16 ENCOUNTER — Encounter (HOSPITAL_COMMUNITY): Payer: Self-pay | Admitting: Emergency Medicine

## 2015-12-16 ENCOUNTER — Emergency Department (HOSPITAL_COMMUNITY)
Admission: EM | Admit: 2015-12-16 | Discharge: 2015-12-16 | Disposition: A | Discharge status: Home / Self Care | Payer: Medicare Other | Attending: Emergency Medicine | Admitting: Emergency Medicine

## 2015-12-16 DIAGNOSIS — N939 Abnormal uterine and vaginal bleeding, unspecified: Secondary | ICD-10-CM | POA: Diagnosis not present

## 2015-12-16 DIAGNOSIS — Z791 Long term (current) use of non-steroidal anti-inflammatories (NSAID): Secondary | ICD-10-CM | POA: Insufficient documentation

## 2015-12-16 DIAGNOSIS — Z3202 Encounter for pregnancy test, result negative: Secondary | ICD-10-CM | POA: Insufficient documentation

## 2015-12-16 DIAGNOSIS — M545 Low back pain: Secondary | ICD-10-CM | POA: Insufficient documentation

## 2015-12-16 DIAGNOSIS — R1084 Generalized abdominal pain: Secondary | ICD-10-CM | POA: Diagnosis present

## 2015-12-16 DIAGNOSIS — Z8659 Personal history of other mental and behavioral disorders: Secondary | ICD-10-CM | POA: Diagnosis not present

## 2015-12-16 DIAGNOSIS — Z9049 Acquired absence of other specified parts of digestive tract: Secondary | ICD-10-CM | POA: Insufficient documentation

## 2015-12-16 DIAGNOSIS — R197 Diarrhea, unspecified: Secondary | ICD-10-CM

## 2015-12-16 DIAGNOSIS — F17200 Nicotine dependence, unspecified, uncomplicated: Secondary | ICD-10-CM | POA: Insufficient documentation

## 2015-12-16 DIAGNOSIS — R112 Nausea with vomiting, unspecified: Secondary | ICD-10-CM | POA: Insufficient documentation

## 2015-12-16 DIAGNOSIS — E669 Obesity, unspecified: Secondary | ICD-10-CM | POA: Insufficient documentation

## 2015-12-16 DIAGNOSIS — Z8709 Personal history of other diseases of the respiratory system: Secondary | ICD-10-CM | POA: Insufficient documentation

## 2015-12-16 DIAGNOSIS — R109 Unspecified abdominal pain: Secondary | ICD-10-CM

## 2015-12-16 DIAGNOSIS — R1110 Vomiting, unspecified: Secondary | ICD-10-CM

## 2015-12-16 LAB — I-STAT BETA HCG BLOOD, ED (MC, WL, AP ONLY): I-stat hCG, quantitative: <5 m[IU]/mL (ref <5)

## 2015-12-16 LAB — CBC
HEMATOCRIT: 41 % (ref 36.0–46.0)
Hemoglobin: 13.5 g/dL (ref 12.0–15.0)
MCH: 32.1 pg (ref 26.0–34.0)
MCHC: 32.9 g/dL (ref 30.0–36.0)
MCV: 97.6 fL (ref 78.0–100.0)
Platelets: 221 10*3/uL (ref 150–400)
RBC: 4.2 MIL/uL (ref 3.87–5.11)
RDW: 14.6 % (ref 11.5–15.5)
WBC: 9.2 10*3/uL (ref 4.0–10.5)

## 2015-12-16 LAB — URINALYSIS, ROUTINE W REFLEX MICROSCOPIC
BILIRUBIN URINE: NEGATIVE
Glucose, UA: NEGATIVE mg/dL
HGB URINE DIPSTICK: NEGATIVE
KETONES UR: NEGATIVE mg/dL
Leukocytes, UA: NEGATIVE
NITRITE: NEGATIVE
PH: 6.5 (ref 5.0–8.0)
Protein, ur: NEGATIVE mg/dL
Specific Gravity, Urine: 1.019 (ref 1.005–1.030)

## 2015-12-16 LAB — COMPREHENSIVE METABOLIC PANEL
ALBUMIN: 3.6 g/dL (ref 3.5–5.0)
ALK PHOS: 48 U/L (ref 38–126)
ALT: 18 U/L (ref 14–54)
AST: 20 U/L (ref 15–41)
Anion gap: 11 (ref 5–15)
BILIRUBIN TOTAL: 0.7 mg/dL (ref 0.3–1.2)
BUN: 11 mg/dL (ref 6–20)
CALCIUM: 9.2 mg/dL (ref 8.9–10.3)
CO2: 25 mmol/L (ref 22–32)
Chloride: 107 mmol/L (ref 101–111)
Creatinine, Ser: 0.73 mg/dL (ref 0.44–1.00)
GFR calc Af Amer: >60 mL/min (ref >60)
GFR calc non Af Amer: >60 mL/min (ref >60)
GLUCOSE: 104 mg/dL — AB (ref 65–99)
Potassium: 3.9 mmol/L (ref 3.5–5.1)
Sodium: 143 mmol/L (ref 135–145)
TOTAL PROTEIN: 7.7 g/dL (ref 6.5–8.1)

## 2015-12-16 LAB — LIPASE, BLOOD: Lipase: 31 U/L (ref 11–51)

## 2015-12-16 MED ORDER — SODIUM CHLORIDE 0.9 % IV BOLUS (SEPSIS)
1000.0000 mL | Freq: Once | INTRAVENOUS | Status: AC
  Administered 2015-12-16: 1000 mL via INTRAVENOUS

## 2015-12-16 MED ORDER — ONDANSETRON HCL 4 MG/2ML IJ SOLN
INTRAMUSCULAR | Status: AC
  Filled 2015-12-16: qty 2

## 2015-12-16 MED ORDER — MORPHINE SULFATE (PF) 4 MG/ML IV SOLN
4.0000 mg | Freq: Once | INTRAVENOUS | Status: AC
  Administered 2015-12-16: 4 mg via INTRAVENOUS
  Filled 2015-12-16: qty 1

## 2015-12-16 MED ORDER — ONDANSETRON HCL 4 MG/2ML IJ SOLN
4.0000 mg | Freq: Once | INTRAMUSCULAR | Status: AC
  Administered 2015-12-16: 4 mg via INTRAVENOUS
  Filled 2015-12-16: qty 2

## 2015-12-16 MED ORDER — ONDANSETRON 8 MG PO TBDP: 8.0000 mg | ORAL_TABLET | Freq: Three times a day (TID) | ORAL | Status: DC | PRN

## 2015-12-16 MED ORDER — ONDANSETRON HCL 4 MG/2ML IJ SOLN
4.0000 mg | Freq: Once | INTRAMUSCULAR | Status: AC
  Administered 2015-12-16: 4 mg via INTRAVENOUS

## 2015-12-16 NOTE — ED Provider Notes (Signed)
CSN: [id number]     Arrival date & time 12/16/15  0255 History  By signing my name below, I, Betty Perez, attest that this documentation has been prepared under the direction and in the presence of Ripley Fraise, MD. Electronically Signed: Altamease Perez, ED Scribe. 12/16/2015. 4:06 AM   Chief Complaint  Patient presents with  . Abdominal Pain   Patient is a 40 y.o. female presenting with vomiting.  Emesis Severity:  Moderate Duration:  2 hours Timing:  Intermittent Quality:  Stomach contents and bright red blood How soon after eating does vomiting occur:  1 hour Progression:  Unchanged Chronicity:  New Recent urination:  Normal Context: not post-tussive   Relieved by:  Nothing Worsened by:  Nothing tried Ineffective treatments:  None tried Associated symptoms: abdominal pain and diarrhea   Associated symptoms: no cough and no fever   Risk factors: no sick contacts and no travel to endemic areas    Betty Perez is a 40 y.o. female who presents to the Emergency Department complaining of n/v/d with onset last night 1 hour after dinner. She notes several episodes of emesis with a small amount of blood noted. No blood noted in her stool. Associated symptoms include diffuse abdominal and lower back cramping, lightheadedness . Pt denies fever, chest pain, SOB, and new cough. No known contact. No recent travel.  Past surgical history includes appendectomy and colon surgery.   Past Medical History  Diagnosis Date  . PTSD (post-traumatic stress disorder)   . Bronchitis   . Obesity    Past Surgical History  Procedure Laterality Date  . Appendectomy    . Colon surgery    . Back surgery    . Neck surgery    . Knee surgery     No family history on file. Social History  Substance Use Topics  . Smoking status: Current Every Day Smoker  . Smokeless tobacco: None  . Alcohol Use: No   OB History    No data available     Review of Systems  Constitutional: Negative for  fever.  Respiratory: Negative for cough and shortness of breath.   Cardiovascular: Negative for chest pain.  Gastrointestinal: Positive for nausea, vomiting, abdominal pain and diarrhea.  Genitourinary: Positive for vaginal bleeding.  Musculoskeletal: Positive for back pain.  All other systems reviewed and are negative.  Allergies  Review of patient's allergies indicates no known allergies.  Home Medications   Prior to Admission medications   Medication Sig Start Date End Date Taking? Authorizing Provider  HYDROcodone-acetaminophen (NORCO/VICODIN) 5-325 MG tablet Take 1 tablet by mouth every 6 (six) hours as needed. 11/11/15   Carmin Muskrat, MD  naproxen (NAPROSYN) 500 MG tablet Take 1 tablet (500 mg total) by mouth 2 (two) times daily with a meal. 11/11/15   Carmin Muskrat, MD   BP 143/95 mmHg  Pulse 95  Temp(Src) 99.2 F (37.3 C) (Oral)  SpO2 100%  LMP 12/02/2015 (Approximate) Physical Exam CONSTITUTIONAL: Well developed/well nourished HEAD: Normocephalic/atraumatic EYES: EOMI/PERRL ENMT: Mucous membranes moist NECK: supple no meningeal signs SPINE/BACK:entire spine nontender CV: S1/S2 noted, no murmurs/rubs/gallops noted LUNGS: Lungs are clear to auscultation bilaterally, no apparent distress ABDOMEN: soft, moderate tenderness in mid abdomen, no rebound or guarding, bowel sounds noted throughout abdomen GU:no cva tenderness NEURO: Pt is awake/alert/appropriate, moves all extremitiesx4.  No facial droop.   EXTREMITIES: pulses normal/equal, full ROM SKIN: warm, color normal PSYCH: no abnormalities of mood noted, alert and oriented to situation  ED Course  Procedures  DIAGNOSTIC STUDIES: Oxygen Saturation is 100% on RA,  normal by my interpretation.    COORDINATION OF CARE: 4:05 AM Discussed treatment plan which includes lab work, IVF, Zofran, and morphine with pt at bedside and pt agreed to plan. 6:15 AM Pt improved Taking PO Walking around room Reports pain  nearly resolved Given vomiting and diarrhea, likely viral illness Discussed return precautions She had mentioned recent vag spotting but none today, no other acute issues   Labs Review Labs Reviewed  COMPREHENSIVE METABOLIC PANEL - Abnormal; Notable for the following:    Glucose, Bld 104 (*)    All other components within normal limits  LIPASE, BLOOD  CBC  URINALYSIS, ROUTINE W REFLEX MICROSCOPIC (NOT AT [facility])  I-STAT BETA HCG BLOOD, ED ([facility], [facility], [facility] ONLY)    I have personally reviewed and evaluated these  lab results as part of my medical decision-making.   Medications  ondansetron (ZOFRAN) injection 4 mg (4 mg Intravenous Given 12/16/15 0313)  sodium chloride 0.9 % bolus 1,000 mL (0 mLs Intravenous Stopped 12/16/15 0500)  ondansetron (ZOFRAN) injection 4 mg (4 mg Intravenous Given 12/16/15 0409)  morphine 4 MG/ML injection 4 mg (4 mg Intravenous Given 12/16/15 0409)  sodium chloride 0.9 % bolus 1,000 mL (0 mLs Intravenous Stopped 12/16/15 0547)  morphine 4 MG/ML injection 4 mg (4 mg Intravenous Given 12/16/15 0502)    MDM   Final diagnoses:  Abdominal pain, unspecified abdominal location  Vomiting and diarrhea    Nursing notes including past medical history and social history reviewed and considered in documentation Labs/vital reviewed myself and considered during evaluation   I personally performed the services described in this documentation, which was scribed in my presence. The recorded information has been reviewed and is accurate.       Ripley Fraise, MD 12/16/15 [phone]

## 2015-12-16 NOTE — Discharge Instructions (Signed)
Abdominal (belly) pain can be caused by many things. any cases can be observed and treated at home after initial evaluation in the emergency department. Even though you are being discharged home, abdominal pain can be unpredictable. Therefore, you need a repeated exam if your pain does not resolve, returns, or worsens. Most patients with abdominal pain don't have to be admitted to the hospital or have surgery, but serious problems like appendicitis and gallbladder attacks can start out as nonspecific pain. Many abdominal conditions cannot be diagnosed in one visit, so follow-up evaluations are very important. °SEEK IMMEDIATE MEDICAL ATTENTION IF: °The pain does not go away or becomes severe, particularly over the next 8-12 hours.  °A temperature above 100.4F develops.  °Repeated vomiting occurs (multiple episodes).  °The pain becomes localized to portions of the abdomen.   In an adult, the left lower portion of the abdomen could be colitis or diverticulitis.  °Blood is being passed in stools or vomit (bright red or black tarry stools).  °Return also if you develop chest pain, difficulty breathing, dizziness or fainting, or become confused, poorly responsive, or inconsolable. ° °

## 2015-12-16 NOTE — ED Notes (Signed)
Pt. reports generalized abdominal pain / low back pain with emesis and diarrhea onset this evening .

## 2018-01-04 ENCOUNTER — Other Ambulatory Visit: Payer: Self-pay | Admitting: Internal Medicine

## 2018-01-04 DIAGNOSIS — Z139 Encounter for screening, unspecified: Secondary | ICD-10-CM

## 2018-01-24 ENCOUNTER — Ambulatory Visit: Payer: Medicare Other

## 2018-02-09 ENCOUNTER — Inpatient Hospital Stay (HOSPITAL_COMMUNITY)
Admission: AD | Admit: 2018-02-09 | Discharge: 2018-02-09 | Disposition: A | Discharge status: Home / Self Care | Payer: Medicare Other | Source: Ambulatory Visit | Attending: Obstetrics & Gynecology | Admitting: Obstetrics & Gynecology

## 2018-02-09 ENCOUNTER — Encounter (HOSPITAL_COMMUNITY): Payer: Self-pay | Admitting: Obstetrics and Gynecology

## 2018-02-09 DIAGNOSIS — I110 Hypertensive heart disease with heart failure: Secondary | ICD-10-CM

## 2018-02-09 DIAGNOSIS — N939 Abnormal uterine and vaginal bleeding, unspecified: Secondary | ICD-10-CM | POA: Diagnosis not present

## 2018-02-09 DIAGNOSIS — F17200 Nicotine dependence, unspecified, uncomplicated: Secondary | ICD-10-CM | POA: Diagnosis not present

## 2018-02-09 DIAGNOSIS — F17210 Nicotine dependence, cigarettes, uncomplicated: Secondary | ICD-10-CM | POA: Diagnosis not present

## 2018-02-09 DIAGNOSIS — Z6841 Body Mass Index (BMI) 40.0 and over, adult: Secondary | ICD-10-CM | POA: Diagnosis not present

## 2018-02-09 DIAGNOSIS — A5901 Trichomonal vulvovaginitis: Secondary | ICD-10-CM | POA: Insufficient documentation

## 2018-02-09 DIAGNOSIS — I10 Essential (primary) hypertension: Secondary | ICD-10-CM | POA: Diagnosis not present

## 2018-02-09 LAB — CBC
HEMATOCRIT: 37.6 % (ref 36.0–46.0)
Hemoglobin: 12.3 g/dL (ref 12.0–15.0)
MCH: 30.6 pg (ref 26.0–34.0)
MCHC: 32.7 g/dL (ref 30.0–36.0)
MCV: 93.5 fL (ref 78.0–100.0)
Platelets: 254 10*3/uL (ref 150–400)
RBC: 4.02 MIL/uL (ref 3.87–5.11)
RDW: 14.5 % (ref 11.5–15.5)
WBC: 6.7 10*3/uL (ref 4.0–10.5)

## 2018-02-09 LAB — WET PREP, GENITAL
Clue Cells Wet Prep HPF POC: NONE SEEN
Sperm: NONE SEEN
Yeast Wet Prep HPF POC: NONE SEEN

## 2018-02-09 LAB — URINALYSIS, ROUTINE W REFLEX MICROSCOPIC
BILIRUBIN URINE: NEGATIVE
Bacteria, UA: NONE SEEN
Glucose, UA: NEGATIVE mg/dL
Ketones, ur: NEGATIVE mg/dL
Leukocytes, UA: NEGATIVE
NITRITE: NEGATIVE
PROTEIN: NEGATIVE mg/dL
Specific Gravity, Urine: 1.021 (ref 1.005–1.030)
pH: 5 (ref 5.0–8.0)

## 2018-02-09 LAB — POCT PREGNANCY, URINE: PREG TEST UR: NEGATIVE

## 2018-02-09 MED ORDER — METRONIDAZOLE 500 MG PO TABS
2000.0000 mg | ORAL_TABLET | Freq: Once | ORAL | Status: AC
  Administered 2018-02-09: 2000 mg via ORAL
  Filled 2018-02-09: qty 4

## 2018-02-09 NOTE — MAU Note (Signed)
Pt reports she had a normal menstral cycle that  ended on 2/27. Started having some spotting on 3/3 after intercourse with cramping since.

## 2018-02-09 NOTE — Discharge Instructions (Signed)
Abdominal Hysterectomy Abdominal hysterectomy is a surgery to remove your womb (uterus). The womb is the part of your body that holds a growing baby. You may need this procedure if:  You have cancer.  You have growths (tumors or fibroids) in your uterus.  You have long-term (chronic) pain.  You are bleeding.  Your womb has slipped down into your vagina.  You have a condition in which the tissue that lines the womb grows outside of its normal place.  You have an infection in your womb.  You have problems with your period.  You may also need other reproductive parts removed. This will depend on why you need to have the surgery. What happens before the procedure? Staying hydrated Follow instructions from your doctor about hydration. This may include:  Up to 2 hours before the procedure - you may continue to drink clear liquids, such as: ? Water. ? Fruit juice. ? Black coffee. ? Plain tea.  Eating and drinking restrictions Follow instructions from your doctor about eating and drinking. These may include:  8 hours before the procedure - stop eating heavy meals or foods, such as: ? Meat. ? Fried foods. ? Fatty foods.  6 hours before the procedure - stop eating light meals or foods, such as: ? Toast. ? Cereal.  6 hours before the procedure - stop drinking: ? Milk ? Drinks that have milk in them.  2 hours before the procedure - stop drinking clear liquids.  Medicines  Ask your doctor about: ? Changing or stopping your normal medicines. This is important if you take diabetes medicines or blood thinners. ? Taking medicines such as aspirin and ibuprofen. These medicines can thin your blood. Do not take these medicines before your procedure if your doctor tells you not to.  You may be given antibiotic medicine. This can help prevent infection.  You may be asked to take medicines that help you poop (laxatives). General instructions  Ask your doctor how your surgical site  will be marked or identified.  You may be asked to shower with a germ-killing soap.  Plan to have someone take you home from the hospital.  Do not use any products that contain nicotine or tobacco, such as cigarettes and e-cigarettes. If you need help quitting, ask your doctor.  You may have an exam or tests done.  You may have a blood or urine sample taken.  You may need to have an enema to clean out your rectum and lower colon.  Talk to your doctor about the changes this procedure may cause. These can be physical or emotional. What happens during the procedure?  To lower your risk of infection: ? Your health care team will wash or clean their hands. ? Your skin will be washed with soap. ? Hair may be removed from the surgical area.  An IV tube will be put into one of your veins.  You will be given one or more of the following: ? A medicine to help you relax (sedative). ? A medicine to make you fall asleep (general anesthetic).  Tight-fitting (compression) stockings will be placed on your legs to help with circulation.  A thin, flexible tube (catheter) will be inserted to help drain your urine.  The doctor will make a cut (incision) through the skin in your lower belly. It may go side-to-side or up-and-down.  The doctor will move the body tissues that cover your womb.  The doctor will remove your womb.  The doctor may take  out any other parts that need to be removed.  The doctor will control the bleeding.  The doctor will close your cut with stitches (sutures), skin glue, or adhesive strips.  A bandage (dressing) will be placed over the cut. The procedure may vary among doctors and hospitals. What happens after the procedure?  You will be given pain medicine if you need it.  Your blood pressure, heart rate, breathing rate, and blood oxygen level will be watched until the medicines you were given have worn off.  You will need to stay in the hospital to recover. Ask  your doctor how long you will need to stay in the hospital after your procedure.  You may have a liquid diet at first. You will most likely return to your usual diet the day after surgery.  You will still have the urinary catheter in place. It will likely be removed the day after surgery.  You may have to wear compression stockings. These stockings help to prevent blood clots and reduce swelling in your legs.  You will be encouraged to walk as soon as possible. You will also use a device or do breathing exercises to keep your lungs clear.  You may need to use a sanitary pad for vaginal discharge. Summary  Abdominal hysterectomy is a surgery to remove your womb (uterus). The womb is the part of your body that holds a growing baby.  Talk to your doctor about the changes this procedure may cause. These can be physical or emotional.  You will be given pain medicine if you need it.  You will need to stay in the hospital to recover for one to two days. Ask your doctor how long you will need to stay in the hospital after your procedure. This information is not intended to replace advice given to you by your health care provider. Make sure you discuss any questions you have with your health care provider. Document Released: 11/20/2013 Document Revised: 11/03/2016 Document Reviewed: 11/03/2016 Elsevier Interactive Patient Education  2017 [employer].  Contraception Choices Contraception, also called birth control, means things to use or ways to try not to get pregnant. Hormonal birth control This kind of birth control uses hormones. Here are some types of hormonal birth control:  A tube that is put under skin of the arm (implant). The tube can stay in for as long as 3 years.  Shots to get every 3 months (injections).  Pills to take every day (birth control pills).  A patch to change 1 time each week for 3 weeks (birth control patch). After that, the patch is taken off for 1 week.  A  ring to put in the vagina. The ring is left in for 3 weeks. Then it is taken out of the vagina for 1 week. Then a new ring is put in.  Pills to take after unprotected sex (emergency birth control pills).  Barrier birth control Here are some types of barrier birth control:  A thin covering that is put on the penis before sex (female condom). The covering is thrown away after sex.  A soft, loose covering that is put in the vagina before sex (female condom). The covering is thrown away after sex.  A rubber bowl that sits over the cervix (diaphragm). The bowl must be made for you. The bowl is put into the vagina before sex. The bowl is left in for 6-8 hours after sex. It is taken out within 24 hours.  A small, soft  cup that fits over the cervix (cervical cap). The cup must be made for you. The cup can be left in for 6-8 hours after sex. It is taken out within 48 hours.  A sponge that is put into the vagina before sex. It must be left in for at least 6 hours after sex. It must be taken out within 30 hours. Then it is thrown away.  A chemical that kills or stops sperm from getting into the uterus (spermicide). It may be a pill, cream, jelly, or foam to put in the vagina. The chemical should be used at least 10-15 minutes before sex.  IUD (intrauterine) birth control An IUD is a small, T-shaped piece of plastic. It is put inside the uterus. There are two kinds:  Hormone IUD. This kind can stay in for 3-5 years.  Copper IUD. This kind can stay in for 10 years.  Permanent birth control Here are some types of permanent birth control:  Surgery to block the fallopian tubes.  Having an insert put into each fallopian tube.  Surgery to tie off the tubes that carry sperm (vasectomy).  Natural planning birth control Here are some types of natural planning birth control:  Not having sex on the days the woman could get pregnant.  Using a calendar: ? To keep track of the length of each  period. ? To find out what days pregnancy can happen. ? To plan to not have sex on days when pregnancy can happen.  Watching for symptoms of ovulation and not having sex during ovulation. One way the woman can check for ovulation is to check her temperature.  Waiting to have sex until after ovulation.  Summary  Contraception, also called birth control, means things to use or ways to try not to get pregnant.  Hormonal methods of birth control include implants, injections, pills, patches, vaginal rings, and emergency birth control pills.  Barrier methods of birth control can include female condoms, female condoms, diaphragms, cervical caps, sponges, and spermicides.  There are two types of IUD (intrauterine device) birth control. An IUD can be put in a woman's uterus to prevent pregnancy for 3-5 years.  Permanent sterilization can be done through a procedure for males, females, or both.  Natural planning methods involve not having sex on the days when the woman could get pregnant. This information is not intended to replace advice given to you by your health care provider. Make sure you discuss any questions you have with your health care provider. Document Released: 09/12/2009 Document Revised: 11/25/2016 Document Reviewed: 11/25/2016 Elsevier Interactive Patient Education  2017 [employer].  Trichomoniasis Trichomoniasis is an STI (sexually transmitted infection) that can affect both women and men. In women, the outer area of the female genitalia (vulva) and the vagina are affected. In men, the penis is mainly affected, but the prostate and other reproductive organs can also be involved. This condition can be treated with medicine. It often has no symptoms (is asymptomatic), especially in men. What are the causes? This condition is caused by an organism called Trichomonas vaginalis. Trichomoniasis most often spreads from person to person (is contagious) through sexual contact. What  increases the risk? The following factors may make you more likely to develop this condition:  Having unprotected sexual intercourse.  Having sexual intercourse with a partner who has trichomoniasis.  Having multiple sexual partners.  Having had previous trichomoniasis infections or other STIs.  What are the signs or symptoms? In women, symptoms of trichomoniasis include:  Abnormal vaginal discharge that is clear, white, gray, or yellow-green and foamy and has an unusual "fishy" odor.  Itching and irritation of the vagina and vulva.  Burning or pain during urination or sexual intercourse.  Genital redness and swelling.  In men, symptoms of trichomoniasis include:  Penile discharge that may be foamy or contain pus.  Pain in the penis. This may happen only when urinating.  Itching or irritation inside the penis.  Burning after urination or ejaculation.  How is this diagnosed? In women, this condition may be found during a routine Pap test or physical exam. It may be found in men during a routine physical exam. Your health care provider may perform tests to help diagnose this infection, such as:  Urine tests (men and women).  The following in women: ? Testing the pH of the vagina. ? A vaginal swab test that checks for the Trichomonas vaginalis organism. ? Testing vaginal secretions.  Your health care provider may test you for other STIs, including HIV (human immunodeficiency virus). How is this treated? This condition is treated with medicine taken by mouth (orally), such as metronidazole or tinidazole to fight the infection. Your sexual partner(s) may also need to be tested and treated.  If you are a woman and you plan to become pregnant or think you may be pregnant, tell your health care provider right away. Some medicines that are used to treat the infection should not be taken during pregnancy.  Your health care provider may recommend over-the-counter medicines or  creams to help relieve itching or irritation. You may be tested for infection again 3 months after treatment. Follow these instructions at home:  Take and use over-the-counter and prescription medicines, including creams, only as told by your health care provider.  Do not have sexual intercourse until one week after you finish your medicine, or until your health care provider approves. Ask your health care provider when you may resume sexual intercourse.  (Women) Do not douche or wear tampons while you have the infection.  Discuss your infection with your sexual partner(s). Make sure that your partner gets tested and treated, if necessary.  Keep all follow-up visits as told by your health care provider. This is important. How is this prevented?  Use condoms every time you have sex. Using condoms correctly and consistently can help protect against STIs.  Avoid having multiple sexual partners.  Talk with your sexual partner about any symptoms that either of you may have, as well as any history of STIs.  Get tested for STIs and STDs (sexually transmitted diseases) before you have sex. Ask your partner to do the same.  Do not have sexual contact if you have symptoms of trichomoniasis or another STI. Contact a health care provider if:  You still have symptoms after you finish your medicine.  You develop pain in your abdomen.  You have pain when you urinate.  You have bleeding after sexual intercourse.  You develop a rash.  You feel nauseous or you vomit.  You plan to become pregnant or think you may be pregnant. Summary  Trichomoniasis is an STI (sexually transmitted infection) that can affect both women and men.  This condition often has no symptoms (is asymptomatic), especially in men.  You should not have sexual intercourse until one week after you finish your medicine, or until your health care provider approves. Ask your health care provider when you may resume sexual  intercourse.  Discuss your infection with your sexual partner. Make  sure that your partner gets tested and treated, if necessary. This information is not intended to replace advice given to you by your health care provider. Make sure you discuss any questions you have with your health care provider. Document Released: 05/11/2001 Document Revised: 10/08/2016 Document Reviewed: 10/08/2016 Elsevier Interactive Patient Education  2017 [place] para dejar de fumar (Steps to Quit Smoking) Fumar tabaco es malo para su salud. Puede afectar a casi cualquier rgano del cuerpo. Fumar lo pone a usted y a Public affairs consultant a su alrededor en riesgo de Sports administrator enfermedades graves de [place] plazo (crnicas). Dejar de fumar es difcil, pero es una de las mejores cosas que puede hacer por su salud. Nunca es muy tarde para dejar de fumar. [place]? Al dejar de fumar, se reduce el riesgo de contraer enfermedades y afecciones graves. Estas pueden incluir los siguientes:  Enfermedad o cncer de pulmn.  Cardiopata coronaria.  Ictus.  Infarto de miocardio.  Imposibilidad de tener hijos (infertilidad).  Huesos dbiles (osteoporosis) y huesos rotos (fracturas). La tos, las sibilancias y la falta de aire son sntomas que mejoran cuando deja de fumar. Es posible tambin que se enferme con Media planner. Si est embarazada, dejar de fumar la ayudar a reducir las probabilidades de Best boy un beb de bajo peso al nacer. QU PUEDO HACER PARA [place]? Pregntele al [employer] cosas que pueden ayudarlo a dejar el hbito. Algunos cosas que puede hacer (estrategias) incluyen:  Dejar de fumar de forma definitiva en lugar de ir reduciendo gradualmente la cantidad de cigarrillos durante un perodo.  Recibir asesoramiento psicolgico individual. Es ms probable que tenga xito si asiste a diversas sesiones de Publishing copy.  Usar recursos y sistemas de soporte, como, por ejemplo: ? Charlas en lnea con un consejero. ? Lneas telefnicas para dejar de fumar. ? Materiales impresos de [place]. ? Grupos de apoyo o asesoramiento psicolgico grupal. ? Programas de mensajes de texto. ? Aplicaciones para telfonos celulares.  Tomar medicamentos. Algunos de estos medicamentos pueden contener nicotina. Si est embarazada o amamantando, no tome ningn medicamento para dejar de fumar, excepto que el mdico lo autorice. Hable con el mdico sobre el asesoramiento psicolgico o sobre otras cosas que [place]. Hable con el mdico sobre usar ms de una estrategia al [employer], [place], por ejemplo, tomar medicamentos y tambin recibir asesoramiento psicolgico. Esto puede facilitarle el proceso para dejar de fumar. QU PUEDO HACER PARA QUE DEJAR DE FUMAR SEA MS FCIL? Al principio, dejar de fumar puede parecer abrumador, pero hay muchas opciones que facilitan el [place]. Tome estas medidas:  Converse con su familia o sus amigos. Busque su apoyo y [place].  Llame a las lneas telefnicas que ayudan a dejar de fumar, pngase en contacto con grupos de apoyo o reciba asesoramiento de un consejero.  Pdale a la gente que fuma que no lo haga a su alrededor.  Evite los lugares que pueden despertar el deseo de fumar (disparadores), como, por ejemplo: ? Bares. ? Fiestas. ? reas para fumar en el trabajo.  Pase tiempo con personas que no fuman.  Disminuya todo tipo de estrs de la vida diaria. El estrs puede hacer que usted desee fumar. Pruebe estas cosas para disminuir el estrs: ? Practicar actividad fsica con regularidad. ? Practicar ejercicios de respiracin profunda. ? Practicar yoga. ? Medite. ? Realizar una visualizacin corporal. Para ello, cierre los ojos, concntrese en [place]  zona del cuerpo a la vez desde la cabeza [facility] dedos de los pies y fjese qu partes del cuerpo estn tensas. Relaje  los msculos de esas reas.  Descargue o compre aplicaciones para telfonos mviles o tabletas que le ayuden a respetar el plan para dejar de fumar. Hay muchas aplicaciones gratuitas, como QuitGuide de los Centros para el Control y la Prevencin de Arboriculturist (CDC, Doctor, hospital for [employer] and Prevention). Puede hallar otros recursos de [employer].gov y en otros sitios web. Esta informacin no tiene Marine scientist el consejo del mdico. Asegrese de hacerle al mdico cualquier pregunta que tenga. Document Released: 12/18/2010 Document Revised: 02/07/2012 Document Reviewed: 04/01/2015 Elsevier Interactive Patient Education  2018 [employer].  Hypertension Hypertension, commonly called high blood pressure, is when the force of blood pumping through the arteries is too strong. The arteries are the blood vessels that carry blood from the heart throughout the body. Hypertension forces the heart to work harder to pump blood and may cause arteries to become narrow or stiff. Having untreated or uncontrolled hypertension can cause heart attacks, strokes, kidney disease, and other problems. A blood pressure reading consists of a higher number over a lower number. Ideally, your blood pressure should be below 120/80. The first ("top") number is called the systolic pressure. It is a measure of the pressure in your arteries as your heart beats. The second ("bottom") number is called the diastolic pressure. It is a measure of the pressure in your arteries as the heart relaxes. What are the causes? The cause of this condition is not known. What increases the risk? Some risk factors for high blood pressure are under your control. Others are not. Factors you can change  Smoking.  Having type 2 diabetes mellitus, high cholesterol, or both.  Not getting enough exercise or physical activity.  Being overweight.  Having too much fat, sugar, calories, or salt (sodium) in your diet.  Drinking  too much alcohol. Factors that are difficult or impossible to change  Having chronic kidney disease.  Having a family history of high blood pressure.  Age. Risk increases with age.  Race. You may be at higher risk if you are African-American.  Gender. Men are at higher risk than women before age 71. After age 79, women are at higher risk than men.  Having obstructive sleep apnea.  Stress. What are the signs or symptoms? Extremely high blood pressure (hypertensive crisis) may cause:  Headache.  Anxiety.  Shortness of breath.  Nosebleed.  Nausea and vomiting.  Severe chest pain.  Jerky movements you cannot control (seizures).  How is this diagnosed? This condition is diagnosed by measuring your blood pressure while you are seated, with your arm resting on a surface. The cuff of the blood pressure monitor will be placed directly against the skin of your upper arm at the level of your heart. It should be measured at least twice using the same arm. Certain conditions can cause a difference in blood pressure between your right and left arms. Certain factors can cause blood pressure readings to be lower or higher than normal (elevated) for a short period of time:  When your blood pressure is higher when you are in a health care provider's office than when you are at home, this is called white coat hypertension. Most people with this condition do not need medicines.  When your blood pressure is higher at home than when you are in a health care provider's office, this is  called masked hypertension. Most people with this condition may need medicines to control blood pressure.  If you have a high blood pressure reading during one visit or you have normal blood pressure with other risk factors:  You may be asked to return on a different day to have your blood pressure checked again.  You may be asked to monitor your blood pressure at home for 1 week or longer.  If you are diagnosed  with hypertension, you may have other blood or imaging tests to help your health care provider understand your overall risk for other conditions. How is this treated? This condition is treated by making healthy lifestyle changes, such as eating healthy foods, exercising more, and reducing your alcohol intake. Your health care provider may prescribe medicine if lifestyle changes are not enough to get your blood pressure under control, and if:  Your systolic blood pressure is above 130.  Your diastolic blood pressure is above 80.  Your personal target blood pressure may vary depending on your medical conditions, your age, and other factors. Follow these instructions at home: Eating and drinking  Eat a diet that is high in fiber and potassium, and low in sodium, added sugar, and fat. An example eating plan is called the DASH (Dietary Approaches to Stop Hypertension) diet. To eat this way: ? Eat plenty of fresh fruits and vegetables. Try to fill half of your plate at each meal with fruits and vegetables. ? Eat whole grains, such as whole wheat pasta, brown rice, or whole grain bread. Fill about one quarter of your plate with whole grains. ? Eat or drink low-fat dairy products, such as skim milk or low-fat yogurt. ? Avoid fatty cuts of meat, processed or cured meats, and poultry with skin. Fill about one quarter of your plate with lean proteins, such as fish, chicken without skin, beans, eggs, and tofu. ? Avoid premade and processed foods. These tend to be higher in sodium, added sugar, and fat.  Reduce your daily sodium intake. Most people with hypertension should eat less than 1,500 mg of sodium a day.  Limit alcohol intake to no more than 1 drink a day for nonpregnant women and 2 drinks a day for men. One drink equals 12 oz of beer, 5 oz of wine, or 1 oz of hard liquor. Lifestyle  Work with your health care provider to maintain a healthy body weight or to lose weight. Ask what an ideal weight  is for you.  Get at least 30 minutes of exercise that causes your heart to beat faster (aerobic exercise) most days of the week. Activities may include walking, swimming, or biking.  Include exercise to strengthen your muscles (resistance exercise), such as pilates or lifting weights, as part of your weekly exercise routine. Try to do these types of exercises for 30 minutes at least 3 days a week.  Do not use any products that contain nicotine or tobacco, such as cigarettes and e-cigarettes. If you need help quitting, ask your health care provider.  Monitor your blood pressure at home as told by your health care provider.  Keep all follow-up visits as told by your health care provider. This is important. Medicines  Take over-the-counter and prescription medicines only as told by your health care provider. Follow directions carefully. Blood pressure medicines must be taken as prescribed.  Do not skip doses of blood pressure medicine. Doing this puts you at risk for problems and can make the medicine less effective.  Ask your  health care provider about side effects or reactions to medicines that you should watch for. Contact a health care provider if:  You think you are having a reaction to a medicine you are taking.  You have headaches that keep coming back (recurring).  You feel dizzy.  You have swelling in your ankles.  You have trouble with your vision. Get help right away if:  You develop a severe headache or confusion.  You have unusual weakness or numbness.  You feel faint.  You have severe pain in your chest or abdomen.  You vomit repeatedly.  You have trouble breathing. Summary  Hypertension is when the force of blood pumping through your arteries is too strong. If this condition is not controlled, it may put you at risk for serious complications.  Your personal target blood pressure may vary depending on your medical conditions, your age, and other factors. For  most people, a normal blood pressure is less than 120/80.  Hypertension is treated with lifestyle changes, medicines, or a combination of both. Lifestyle changes include weight loss, eating a healthy, low-sodium diet, exercising more, and limiting alcohol. This information is not intended to replace advice given to you by your health care provider. Make sure you discuss any questions you have with your health care provider. Document Released: 11/15/2005 Document Revised: 10/13/2016 Document Reviewed: 10/13/2016 Elsevier Interactive Patient Education  [employer].

## 2018-02-09 NOTE — MAU Note (Addendum)
Pt c/o bleeding and upper abdominal pain since 3/5 last period on 2/21.  Bleeding has lasted over a week with "weird blood" that has mucus and small clots. Pt reports "fluttering and knotting" in stomach, fewer Bm and less gas than normal.  Pt reports no sex since 3/2.  UPT neg.  Pt reports taking medication to lose weight but stopped when she started bleeding, but has been taking "on and off" for years.

## 2018-02-09 NOTE — MAU Provider Note (Signed)
History     CSN: [id number]  Arrival date and time: 02/09/18 1053   First Provider Initiated Contact with Patient 02/09/18 1655      Chief Complaint  Patient presents with  . Vaginal Bleeding   Betty Perez is a 42 y.o. [id number] presenting with vaginal bleeding. LNMP 01/19/1018 x 7days, heavy, with clots. Began pink->brown spotting on 01/31/2018 which has been intermittent since then. Once yesterday had gush of redder blood. Today only spotting. She has had associated suprapubic cramping and also an episode of upper abdominal cramping pain over the past 2 weeks. No home treatments. Had intercourse last on 01/28/2018. No orthostatic sx. See GYN hx below re: hx prolonged bleeding and intermenstrual spotting. Denies irritative vaginal discharge. No new sexual partner. No contraception. Sleeps all day and attends school for GED in evenings. Little social support. Stress over daughter in [place].  Comorbidiites: morbid obesity, CHTN, arthritis. Has a PCP and has antihypertensive meds (?HCTZ)  but has not taken them recently. Has diet pills but has not taken for 1 week.      GYN History MS hx. 10x 3-4 weeks x 7d. Had episode about 2 years ago of bleeding on and off alsmost continuously x 6 months. Had intermenstrual spotting last year.States had polyps removed with D&C in 2013. Last Pap several years ago, normal.   OB History  Gravida Para Term Preterm AB Living  9 2 1 1 7 1   SAB TAB Ectopic Multiple Live Births  5 2     1     # Outcome Date GA Lbr Len/2nd Weight Sex Delivery Anes PTL Lv  9 SAB 2013     SAB     8 SAB 2007     SAB     7 SAB 2007     SAB     6 SAB 2002     SAB     5 SAB 1999 [redacted]w[redacted]d    SAB     4 TAB 1994     TAB     3 Term 1994     Vag-Spont   LIV  2 Preterm 1992 [redacted]w[redacted]d    Vag-Spont   FD  1 TAB 1990              Past Medical History:  Diagnosis Date  . Bronchitis   . Obesity   . PTSD (post-traumatic stress disorder)     Past Surgical History:  Procedure Laterality Date   . APPENDECTOMY    . BACK SURGERY    . COLON SURGERY    . KNEE SURGERY    . NECK SURGERY      History reviewed. No pertinent family history.  Social History   Tobacco Use  . Smoking status: Current Every Day Smoker  . Smokeless tobacco: Never Used  Substance Use Topics  . Alcohol use: No  . Drug use: No  Smokes 1.5 PPD and marijuana on weekends  Allergies: No Known Allergies  Medications Prior to Admission  Medication Sig Dispense Refill Last Dose  . Naproxen Sod-Diphenhydramine (ALEVE PM) 220-25 MG TABS Take 1 tablet by mouth at bedtime as needed (sleep).   Past Month at Unknown time  . ondansetron (ZOFRAN ODT) 8 MG disintegrating tablet Take 1 tablet (8 mg total) by mouth every 8 (eight) hours as needed for vomiting. 4 tablet 0     Review of Systems  Constitutional: Positive for fatigue. Negative for activity change, appetite change and fever.  HENT: Negative for congestion.  Respiratory: Negative for shortness of breath.   Genitourinary: Positive for pelvic pain and vaginal bleeding. Negative for dysuria, hematuria, vaginal discharge and vaginal pain.  Musculoskeletal: Positive for back pain.       Knee pain  Skin: Negative for pallor.  Neurological: Negative for dizziness.  Psychiatric/Behavioral: Negative for agitation. The patient is not nervous/anxious.    Physical Exam   Blood pressure (!) 143/104, pulse 93, temperature 98.3 F (36.8 C), resp. rate 16, height 5\' 4"  (1.626 m), weight 294 lb (133.4 kg), last menstrual period 01/18/2018.  Physical Exam  Nursing note and vitals reviewed. Constitutional: She is oriented to person, place, and time. She appears well-developed and well-nourished. No distress.  HENT:  Head: Normocephalic.  Eyes: Pupils are equal, round, and reactive to light.  Neck: Neck supple. No thyromegaly present.  Cardiovascular: Normal rate.  Respiratory: Effort normal.  GI: Soft. There is no tenderness. There is no guarding.  Obese, large  pannus  Genitourinary: Vagina normal.  Genitourinary Comments: Spec: NEFG; vagina ruggated, scant brown blood in vault without evident active bleeding; Cx parous, Nabothian cysts at 4 and 8 o'clock Bimanual: Cx lumpy consistency, unable to asses uterus and adnexae due to body habitus  Musculoskeletal: Normal range of motion. She exhibits edema.  Tr dependent  Neurological: She is alert and oriented to person, place, and time.  Skin: Skin is warm and dry.  Psychiatric: She has a normal mood and affect. Her behavior is normal.    MAU Course  Procedures Results for orders placed or performed during the hospital encounter of 02/09/18 (from the past 24 hour(s))  Urinalysis, Routine w reflex microscopic     Status: Abnormal   Collection Time: 02/09/18 11:10 AM  Result Value Ref Range   Color, Urine YELLOW YELLOW   APPearance CLEAR CLEAR   Specific Gravity, Urine 1.021 1.005 - 1.030   pH 5.0 5.0 - 8.0   Glucose, UA NEGATIVE NEGATIVE mg/dL   Hgb urine dipstick MODERATE (A) NEGATIVE   Bilirubin Urine NEGATIVE NEGATIVE   Ketones, ur NEGATIVE NEGATIVE mg/dL   Protein, ur NEGATIVE NEGATIVE mg/dL   Nitrite NEGATIVE NEGATIVE   Leukocytes, UA NEGATIVE NEGATIVE   RBC / HPF 6-30 0 - 5 RBC/hpf   WBC, UA 0-5 0 - 5 WBC/hpf   Bacteria, UA NONE SEEN NONE SEEN   Squamous Epithelial / LPF 0-5 (A) NONE SEEN  Pregnancy, urine POC     Status: None   Collection Time: 02/09/18 11:58 AM  Result Value Ref Range   Preg Test, Ur NEGATIVE NEGATIVE  CBC     Status: None   Collection Time: 02/09/18  5:16 PM  Result Value Ref Range   WBC 6.7 4.0 - 10.5 K/uL   RBC 4.02 3.87 - 5.11 MIL/uL   Hemoglobin 12.3 12.0 - 15.0 g/dL   HCT 37.6 36.0 - 46.0 %   MCV 93.5 78.0 - 100.0 fL   MCH 30.6 26.0 - 34.0 pg   MCHC 32.7 30.0 - 36.0 g/dL   RDW 14.5 11.5 - 15.5 %   Platelets 254 150 - 400 K/uL  Wet prep, genital     Status: Abnormal   Collection Time: 02/09/18  6:00 PM  Result Value Ref Range   Yeast Wet Prep HPF  POC NONE SEEN NONE SEEN   Trich, Wet Prep PRESENT (A) NONE SEEN   Clue Cells Wet Prep HPF POC NONE SEEN NONE SEEN   WBC, Wet Prep HPF POC MODERATE (A) NONE SEEN  Sperm NONE SEEN   GC/CT, HIV sent.  MDM AUB without evidence of anemia or heavy flow at present. Needs GYN followup with Pap, endo biopsy, contraceptive counseling> referred to [place]  Flagyl 2 gm po given  Advised to start taking BP meds as instructed and make F/U appointment with PCP. Dr. Sheryle Hail   Counseled on smoking cessation Outpatient [place] ordered to be scheduled prior to GYN appointment  Assessment and Plan   1. Abnormal uterine bleeding   2. Morbid obesity ([place])   3. Essential hypertension   4. Trichomonas vaginitis   5. Current every day smoker    Medlist:  [place] for [place] Follow up in 1 month(s).   Specialty:  Obstetrics and Gynecology Why:  Someone from radiology will call with [place] appointment and someone from [facility] will call with GYN appointment Contact information: [address] [place] 02/09/2018, 4:56 PM

## 2018-02-10 ENCOUNTER — Encounter: Payer: Self-pay | Admitting: Obstetrics and Gynecology

## 2018-02-10 LAB — GC/CHLAMYDIA PROBE AMP (~~LOC~~) NOT AT ARMC
Chlamydia: NEGATIVE
Neisseria Gonorrhea: NEGATIVE

## 2018-02-10 LAB — HIV ANTIBODY (ROUTINE TESTING W REFLEX): HIV Screen 4th Generation wRfx: NONREACTIVE

## 2018-02-16 ENCOUNTER — Ambulatory Visit (HOSPITAL_COMMUNITY): Payer: Medicare Other | Attending: Obstetrics and Gynecology

## 2018-03-20 ENCOUNTER — Encounter: Payer: Medicare Other | Admitting: Obstetrics and Gynecology

## 2018-03-20 ENCOUNTER — Encounter: Payer: Self-pay | Admitting: Obstetrics and Gynecology

## 2018-03-21 NOTE — Progress Notes (Signed)
Patient did not keep GYN appointment for 03/20/2018.  Durene Romans MD Attending Center for [employer] Fish farm manager)

## 2018-04-13 ENCOUNTER — Other Ambulatory Visit: Payer: Self-pay

## 2018-04-13 ENCOUNTER — Encounter: Payer: Self-pay | Admitting: Obstetrics & Gynecology

## 2018-04-13 ENCOUNTER — Ambulatory Visit (INDEPENDENT_AMBULATORY_CARE_PROVIDER_SITE_OTHER): Payer: Medicare Other | Admitting: Clinical

## 2018-04-13 ENCOUNTER — Other Ambulatory Visit (HOSPITAL_COMMUNITY)
Admission: RE | Admit: 2018-04-13 | Discharge: 2018-04-13 | Disposition: A | Discharge status: Home / Self Care | Payer: Medicare Other | Source: Ambulatory Visit | Attending: Obstetrics & Gynecology | Admitting: Obstetrics & Gynecology

## 2018-04-13 ENCOUNTER — Ambulatory Visit (INDEPENDENT_AMBULATORY_CARE_PROVIDER_SITE_OTHER): Payer: Medicare Other | Admitting: Obstetrics & Gynecology

## 2018-04-13 VITALS — BP 127/88 | HR 73 | Ht 65.0 in | Wt 293.8 lb

## 2018-04-13 DIAGNOSIS — F3163 Bipolar disorder, current episode mixed, severe, without psychotic features: Secondary | ICD-10-CM

## 2018-04-13 DIAGNOSIS — Z0000 Encounter for general adult medical examination without abnormal findings: Secondary | ICD-10-CM | POA: Diagnosis present

## 2018-04-13 DIAGNOSIS — F4310 Post-traumatic stress disorder, unspecified: Secondary | ICD-10-CM

## 2018-04-13 DIAGNOSIS — D1723 Benign lipomatous neoplasm of skin and subcutaneous tissue of right leg: Secondary | ICD-10-CM | POA: Diagnosis not present

## 2018-04-13 DIAGNOSIS — F419 Anxiety disorder, unspecified: Secondary | ICD-10-CM

## 2018-04-13 DIAGNOSIS — N938 Other specified abnormal uterine and vaginal bleeding: Secondary | ICD-10-CM

## 2018-04-13 DIAGNOSIS — F310 Bipolar disorder, current episode hypomanic: Secondary | ICD-10-CM

## 2018-04-13 DIAGNOSIS — L309 Dermatitis, unspecified: Secondary | ICD-10-CM | POA: Diagnosis not present

## 2018-04-13 DIAGNOSIS — F32A Depression, unspecified: Secondary | ICD-10-CM

## 2018-04-13 DIAGNOSIS — F329 Major depressive disorder, single episode, unspecified: Secondary | ICD-10-CM

## 2018-04-13 NOTE — BH Specialist Note (Addendum)
Integrated Behavioral Health Initial Visit  MRN: [id number] Name: Betty Perez  Number of [place] Clinician visits:: 1/6 Session Start time: 10:05  Session End time: 11:05 Total time: 1 hour  Type of Service: [place] Interpretor:No. Interpretor Name and Language: n/a   Warm Hand Off Completed.       SUBJECTIVE: Betty Perez is a 42 y.o. female accompanied by n/a Patient was referred by Dr Hulan Fray for depression and anxiety. Patient reports the following symptoms/concerns: Pt states her primary concern today is feeling overly anxious after her phone broke; playing games on her phone was her main self-coping strategy for escalating stress and anxiety. Pt says she has not been treated for Bipolar affective disorder or PTSD, stemming from childhood trauma, since she moved to [place] from [place] about 5 years ago. Pt has not slept in about 36 hours, after phone broke.  Duration of problem: Increase in past two days with no working phone; Severity of problem: severe  OBJECTIVE: Mood: Anxious and Affect: Appropriate Risk of harm to self or others: No plan to harm self or others  LIFE CONTEXT: Family and Social: Pt lives by herself; pt feels mildly supported by one friend/neighbor, her 41yo daughter, and mother/extended family in [place] School/Work: Taking GED classes at [employer]: Phone games for self-care Life Changes: Broke phone two days ago(was her main coping strategy)  GOALS ADDRESSED: Patient will: 1. Reduce symptoms of: agitation, anxiety, depression, insomnia, mood instability and stress 2. Increase knowledge and/or ability of: self-management skills and stress reduction  3. Demonstrate ability to: Increase healthy adjustment to current life circumstances and Increase adequate support systems for patient/family  INTERVENTIONS: Interventions utilized: Mindfulness or Psychologist, educational, Sleep Hygiene,  Psychoeducation and/or Health Education and Link to [employer]  Standardized Assessments completed: GAD-7 and PHQ 9  ASSESSMENT: Patient currently experiencing:   1. Biploar affective disorder, mixed, severe, without psychotic features and  2. PTSD(Post traumatic stress disorder)   Patient may benefit from psychoeducation and brief therapeutic interventions regarding coping with symptoms of anxiety and depression related to current symptoms of bipolar affective disorder. Marland Kitchen  PLAN: 1. Follow up with behavioral health clinician on : One month 2. Behavioral recommendations:  -Consider walking to phone repair shop today to either fix or upgrade phone -CALM relaxation breathing exercise every morning and at bedtime nightly; more as needed throughout the day -[facility] of the [facility] walk-in clinic to establish care within one week, for ongoing therapy  -When phone is working again, consider sleep app as alternative to TV at night -Consider [employer] for classes and workshops of interest -Read educational materials regarding coping with symptoms of anxiety and depression  3. Referral(s): [place] (In Clinic) and [place] (LME/Outside Clinic) 4. "From scale of 1-10, how likely are you to follow plan?": 9  Garlan Fair, LCSW  Depression screen [facility] 2/9 04/13/2018  Decreased Interest 3  Down, Depressed, Hopeless 3  PHQ - 2 Score 6  Altered sleeping 3  Tired, decreased energy 3  Change in appetite 3  Feeling bad or failure about yourself  3  Trouble concentrating 3  Moving slowly or fidgety/restless 3  Suicidal thoughts 0  PHQ-9 Score 24   GAD 7 : Generalized Anxiety Score 04/13/2018  Nervous, Anxious, on Edge 3  Control/stop worrying 3  Worry too much - different things 3  Trouble relaxing 3  Restless 2  Easily annoyed or irritable 3  Afraid - awful might happen 1  Total GAD 7 Score 18

## 2018-04-13 NOTE — Progress Notes (Signed)
Pt wants TOC for trich, also has a "hair bump" she would like looked at in right groin area. Request rx for eczema on hands/arms. PHQ9 and GAD7 is elevated, pt would like to see Roselyn Reef today. Patient and/or legal guardian verbally consented to meet with [place] about presenting concerns.

## 2018-04-13 NOTE — Progress Notes (Signed)
Patient ID: Betty Perez, female   DOB: February 15, 1976, 42 y.o.   MRN: [id number]  No chief complaint on file. boyfriend cheated- STD- trichomonas- gave 4 pills of flagyl- wants to know if gone; clear white discharge, odor March 24th ED - diagnosed Ingrown hair on vagina from shaving, hurts when walk;  Ultrasound couldn't make it to appointment- polyps- April 22- transportation issue  Irregular bleed- disappear for 1-2 days, come back from 7-10 days; sometimes spotting for   HPI Betty Perez is a 42 y.o. female. Single P1  HPI  Past Medical History:  Diagnosis Date  . Bronchitis   . Obesity   . PTSD (post-traumatic stress disorder)     Past Surgical History:  Procedure Laterality Date  . APPENDECTOMY    . BACK SURGERY    . COLON SURGERY    . KNEE SURGERY    . NECK SURGERY      No family history on file.  Social History Social History   Tobacco Use  . Smoking status: Current Every Day Smoker  . Smokeless tobacco: Never Used  Substance Use Topics  . Alcohol use: No  . Drug use: No    No Known Allergies  Current Outpatient Medications  Medication Sig Dispense Refill  . Naproxen Sod-Diphenhydramine (ALEVE PM) 220-25 MG TABS Take 1 tablet by mouth at bedtime as needed (sleep).     No current facility-administered medications for this visit.     Review of Systems Review of Systems  There were no vitals taken for this visit.  Physical Exam Physical Exam  Breathing, conversing, and ambulating normally Morbidly obese Black female She has a large lipoma on her right thigh She has a deep "boil" on her right mons Her discharge is frothy, either more trich or BV  Data Reviewed  Results for Betty Perez (MRN [id number]) as of 04/13/2018 08:28  Ref. Range 02/09/2018 17:16  WBC Latest Ref Range: 4.0 - 10.5 K/uL 6.7  RBC Latest Ref Range: 3.87 - 5.11 MIL/uL 4.02  Hemoglobin Latest Ref Range: 12.0 - 15.0 g/dL 12.3  HCT Latest Ref Range: 36.0 - 46.0 % 37.6  MCV Latest  Ref Range: 78.0 - 100.0 fL 93.5  MCH Latest Ref Range: 26.0 - 34.0 pg 30.6  MCHC Latest Ref Range: 30.0 - 36.0 g/dL 32.7  RDW Latest Ref Range: 11.5 - 15.5 % 14.5  Platelets Latest Ref Range: 150 - 400 K/uL 254   Assessment    Eczema and deep boil on her right mons- refer to dermatology DUB- check gyn u/s, TSh  Desire for STI testing Preventative care- pap and mammogram  Lipoma on right thigh- refer to gen surg  Plan  Come back 4 weeks Integrated b med today        Emily Filbert 04/13/2018, 8:25 AM

## 2018-04-14 LAB — CYTOLOGY - PAP
Bacterial vaginitis: POSITIVE — AB
CHLAMYDIA, DNA PROBE: NEGATIVE
Candida vaginitis: NEGATIVE
Diagnosis: NEGATIVE
HPV: NOT DETECTED
NEISSERIA GONORRHEA: NEGATIVE
TRICH (WINDOWPATH): POSITIVE — AB

## 2018-04-14 LAB — HEPATITIS C ANTIBODY

## 2018-04-14 LAB — HEPATITIS B SURFACE ANTIGEN: Hepatitis B Surface Ag: NEGATIVE

## 2018-04-14 LAB — RPR: RPR: NONREACTIVE

## 2018-04-18 ENCOUNTER — Ambulatory Visit (HOSPITAL_COMMUNITY): Payer: Medicare Other | Attending: Obstetrics and Gynecology

## 2018-04-20 ENCOUNTER — Telehealth: Payer: Self-pay | Admitting: Clinical

## 2018-04-20 NOTE — Telephone Encounter (Signed)
Follow-up, as requested by pt; left HIPPA-compliant message to return call to [place] from [place] for [place] at [facility] at [phone].

## 2018-04-21 ENCOUNTER — Telehealth: Payer: Self-pay

## 2018-04-21 MED ORDER — METRONIDAZOLE 500 MG PO TABS: 2000.0000 mg | ORAL_TABLET | Freq: Once | ORAL | 0 refills | Status: AC

## 2018-04-21 NOTE — Telephone Encounter (Signed)
Per Dr. Hulan Fray, pt needs to be  tx'd again for Trich.  Notified pt of recurrent results and that tx has been sent to [place] on [address].  Pt stated that she must not have waiting long enough.  I informed pt to please make sure that her partner(s) get treated.  Pt stated understanding with no further questions.

## 2018-04-26 LAB — SPECIMEN STATUS REPORT

## 2018-04-26 LAB — TSH: TSH: 2.16 u[IU]/mL (ref 0.450–4.500)

## 2018-04-27 ENCOUNTER — Ambulatory Visit (HOSPITAL_COMMUNITY)
Admission: RE | Admit: 2018-04-27 | Discharge: 2018-04-27 | Disposition: A | Discharge status: Home / Self Care | Payer: Medicare Other | Source: Ambulatory Visit | Attending: Obstetrics and Gynecology | Admitting: Obstetrics and Gynecology

## 2018-04-27 DIAGNOSIS — N852 Hypertrophy of uterus: Secondary | ICD-10-CM | POA: Diagnosis not present

## 2018-04-27 DIAGNOSIS — R939 Diagnostic imaging inconclusive due to excess body fat of patient: Secondary | ICD-10-CM | POA: Diagnosis not present

## 2018-04-27 DIAGNOSIS — N939 Abnormal uterine and vaginal bleeding, unspecified: Secondary | ICD-10-CM

## 2018-05-03 ENCOUNTER — Ambulatory Visit
Admission: RE | Admit: 2018-05-03 | Discharge: 2018-05-03 | Disposition: A | Discharge status: Home / Self Care | Payer: Medicare Other | Source: Ambulatory Visit | Attending: Internal Medicine | Admitting: Internal Medicine

## 2018-05-03 DIAGNOSIS — Z139 Encounter for screening, unspecified: Secondary | ICD-10-CM

## 2018-05-17 ENCOUNTER — Ambulatory Visit: Payer: Self-pay | Admitting: Obstetrics & Gynecology

## 2018-05-17 NOTE — Progress Notes (Signed)
Per Dr. Hulan Fray pt does not need to be contacted in regards to missed appt.

## 2018-05-18 ENCOUNTER — Other Ambulatory Visit: Payer: Self-pay

## 2018-05-18 ENCOUNTER — Emergency Department (HOSPITAL_COMMUNITY)
Admission: EM | Admit: 2018-05-18 | Discharge: 2018-05-18 | Disposition: A | Discharge status: Home / Self Care | Payer: Medicare Other | Attending: Emergency Medicine | Admitting: Emergency Medicine

## 2018-05-18 ENCOUNTER — Encounter (HOSPITAL_COMMUNITY): Payer: Self-pay

## 2018-05-18 DIAGNOSIS — Z202 Contact with and (suspected) exposure to infections with a predominantly sexual mode of transmission: Secondary | ICD-10-CM

## 2018-05-18 DIAGNOSIS — R21 Rash and other nonspecific skin eruption: Secondary | ICD-10-CM | POA: Insufficient documentation

## 2018-05-18 DIAGNOSIS — I10 Essential (primary) hypertension: Secondary | ICD-10-CM | POA: Insufficient documentation

## 2018-05-18 DIAGNOSIS — W57XXXA Bitten or stung by nonvenomous insect and other nonvenomous arthropods, initial encounter: Secondary | ICD-10-CM | POA: Diagnosis not present

## 2018-05-18 DIAGNOSIS — F17200 Nicotine dependence, unspecified, uncomplicated: Secondary | ICD-10-CM | POA: Insufficient documentation

## 2018-05-18 DIAGNOSIS — Z79899 Other long term (current) drug therapy: Secondary | ICD-10-CM | POA: Diagnosis not present

## 2018-05-18 DIAGNOSIS — L539 Erythematous condition, unspecified: Secondary | ICD-10-CM | POA: Diagnosis present

## 2018-05-18 HISTORY — DX: Benign lipomatous neoplasm, unspecified: D17.9

## 2018-05-18 MED ORDER — CEPHALEXIN 500 MG PO CAPS: 500.0000 mg | ORAL_CAPSULE | Freq: Three times a day (TID) | ORAL | 0 refills | Status: DC

## 2018-05-18 MED ORDER — METRONIDAZOLE 500 MG PO TABS: 500.0000 mg | ORAL_TABLET | Freq: Two times a day (BID) | ORAL | 0 refills | Status: DC

## 2018-05-18 NOTE — Discharge Instructions (Signed)
Flagyl, 500 mg by mouth twice a day for 7 days, cephalexin, 500 mg by mouth 3 times daily for 7 days  Seek medical attention for severe or worsening symptoms

## 2018-05-18 NOTE — ED Provider Notes (Signed)
[facility] DEPT Provider Note   CSN: [id number] Arrival date & time: 05/18/18  2035     History   Chief Complaint Chief Complaint  Patient presents with  . Insect Bite  . Nausea    HPI Betty Perez is a 42 y.o. female.  HPI  The pt is a 42 y/o female - hx of a lipoma on the R thigh -states that approximately 1 week ago Betty Perez had a acute onset of what Betty Perez felt like was an insect bite, burning stinging sensation with development of redness in that area.  It was tender, burning, warm to the touch, now 6 days later it is still there, feels like it is getting a little bit bigger.  Denies fevers or chills.  Betty Perez also has history of trichomonas, states that Betty Perez had intercourse with the same person and gave it to her and feels like Betty Perez has the discharge again.  Betty Perez is [name]  Results from her visit in March where Betty Perez was seen at [facility] hospital are reviewed, negative for gonorrhea or chlamydia, positive for trichomonas  Past Medical History:  Diagnosis Date  . Bronchitis   . Hypertension   . Lipoma   . Obesity   . PTSD (post-traumatic stress disorder)     Patient Active Problem List   Diagnosis Date Noted  . Morbid obesity ([place]) 02/09/2018  . Essential hypertension 02/09/2018  . Abnormal uterine bleeding 02/09/2018  . Current every day smoker 02/09/2018    Past Surgical History:  Procedure Laterality Date  . APPENDECTOMY    . BACK SURGERY    . COLON SURGERY    . KNEE SURGERY    . NECK SURGERY       OB History    Gravida  9   Para  2   Term  1   Preterm  1   AB  7   Living  1     SAB  5   TAB  2   Ectopic      Multiple      Live Births  1            Home Medications    Prior to Admission medications   Medication Sig Start Date End Date Taking? Authorizing Provider  atenolol (TENORMIN) 50 MG tablet Take 50 mg by mouth daily.   Yes [provider]    hydrochlorothiazide (HYDRODIURIL) 25 MG tablet Take 25 mg by mouth daily.   Yes [provider]  Multiple Vitamins-Minerals (WOMENS MULTIVITAMIN PO) Take 1 tablet by mouth daily.   Yes [provider]  phentermine (ADIPEX-P) 37.5 MG tablet Take 37.5 mg by mouth daily before breakfast.   Yes [provider]  vitamin E 100 UNIT capsule Take 100 Units by mouth daily.   Yes [provider]  cephALEXin (KEFLEX) 500 MG capsule Take 1 capsule (500 mg total) by mouth 3 (three) times daily. 05/18/18   Noemi Chapel, MD  metroNIDAZOLE (FLAGYL) 500 MG tablet Take 1 tablet (500 mg total) by mouth 2 (two) times daily. 05/18/18   Noemi Chapel, MD    Family History History reviewed. No pertinent family history.  Social History Social History   Tobacco Use  . Smoking status: Current Every Day Smoker  . Smokeless tobacco: Never Used  Substance Use Topics  . Alcohol use: No  . Drug use: No     Allergies   Patient has no  known allergies.   Review of Systems Review of Systems  All other systems reviewed and are negative.    Physical Exam Updated Vital Signs BP (!) 141/95 (BP Location: Right Arm)   Pulse 79   Temp 98.1 F (36.7 C) (Oral)   Resp 18   Ht 5\' 5"  (1.651 m)   Wt 132.9 kg (293 lb)   LMP 04/26/2018   SpO2 98%   BMI 48.76 kg/m   Physical Exam  Constitutional: Betty Perez appears well-developed and well-nourished. No distress.  HENT:  Head: Normocephalic and atraumatic.  Mouth/Throat: Oropharynx is clear and moist. No oropharyngeal exudate.  Eyes: Pupils are equal, round, and reactive to light. Conjunctivae and EOM are normal. Right eye exhibits no discharge. Left eye exhibits no discharge. No scleral icterus.  Neck: Normal range of motion. Neck supple. No JVD present. No thyromegaly present.  Cardiovascular: Normal rate, regular rhythm, normal heart sounds and intact distal pulses. Exam reveals no gallop and no friction rub.  No murmur  heard. Pulmonary/Chest: Effort normal and breath sounds normal. No respiratory distress. Betty Perez has no wheezes. Betty Perez has no rales.  Abdominal: Soft. Bowel sounds are normal. Betty Perez exhibits no distension and no mass. There is no tenderness.  Musculoskeletal: Normal range of motion. Betty Perez exhibits no edema or tenderness.  Nontender lipoma to the anterior right thigh, area of approximately 5 cm in diameter of warmth redness and mild induration centered over the medial superior area of the lipoma  Lymphadenopathy:    Betty Perez has no cervical adenopathy.  Neurological: Betty Perez is alert. Coordination normal.  Skin: Skin is warm and dry. Rash noted. No erythema.  Rash as noted above  Psychiatric: Betty Perez has a normal mood and affect. Her behavior is normal.  Nursing note and vitals reviewed.    ED Treatments / Results  Labs (all labs ordered are listed, but only abnormal results are displayed) Labs Reviewed - No data to display  EKG None  Radiology No results found.  Procedures Procedures (including critical care time)  Medications Ordered in ED Medications - No data to display   Initial Impression / Assessment and Plan / ED Course  I have reviewed the triage vital signs and the nursing notes.  Pertinent labs & imaging results that were available during my care of the patient were reviewed by me and considered in my medical decision making (see chart for details).    The patient is well-appearing, Betty Perez will need retreatment for trichomonas, but I gave her some advice on her choice of sexual partners with which Betty Perez agreed, Betty Perez will be treated with Flagyl for trichomonas as well as cephalexin 500mg  tid X 7 days  Final Clinical Impressions(s) / ED Diagnoses   Final diagnoses:  Rash in adult  Trichomonas exposure    ED Discharge Orders        Ordered    metroNIDAZOLE (FLAGYL) 500 MG tablet  2 times daily     05/18/18 2132    cephALEXin (KEFLEX) 500 MG capsule  3 times daily     05/18/18 2132        Noemi Chapel, MD 05/18/18 2132

## 2018-05-18 NOTE — ED Triage Notes (Signed)
Pt arrives via EMS today--pt has a lipoma on right upper thigh. Reports she was bitten by a bug 5 days ago. There has been an increased bruised area that has gotten worse sense then + nausea, hot flashes. Pt only c/o nausea, no emesis and pain 9/10 on right thigh. No fevers.

## 2018-06-12 ENCOUNTER — Telehealth: Payer: Self-pay | Admitting: Obstetrics & Gynecology

## 2018-06-12 NOTE — Telephone Encounter (Signed)
Patient called she said she Dr.Dove did a referral for her to get surgery on her leg and she said she haven't heard anything regarding the appt.

## 2018-06-12 NOTE — Telephone Encounter (Signed)
Referral was placed on 04/13/2018

## 2018-06-14 ENCOUNTER — Telehealth: Payer: Self-pay

## 2018-06-14 NOTE — Telephone Encounter (Signed)
Referral has been sent to [facility]. All clinical notes and demographics have been sent. Patient has been contacted and made aware the office will be calling her to schedule an appointment.

## 2018-06-14 NOTE — Telephone Encounter (Signed)
Call patient regarding referral place for [place] [facility]. She is aware they will be calling her to set up an appointment.

## 2018-07-13 ENCOUNTER — Ambulatory Visit: Payer: Medicare Other | Admitting: Obstetrics & Gynecology

## 2018-07-17 ENCOUNTER — Other Ambulatory Visit: Payer: Self-pay | Admitting: General Surgery

## 2018-07-17 DIAGNOSIS — L729 Follicular cyst of the skin and subcutaneous tissue, unspecified: Secondary | ICD-10-CM

## 2018-07-27 ENCOUNTER — Ambulatory Visit
Admission: RE | Admit: 2018-07-27 | Discharge: 2018-07-27 | Disposition: A | Discharge status: Home / Self Care | Payer: Medicare Other | Source: Ambulatory Visit | Attending: General Surgery | Admitting: General Surgery

## 2018-07-27 DIAGNOSIS — L729 Follicular cyst of the skin and subcutaneous tissue, unspecified: Secondary | ICD-10-CM

## 2018-07-27 MED ORDER — GADOBENATE DIMEGLUMINE 529 MG/ML IV SOLN
20.0000 mL | Freq: Once | INTRAVENOUS | Status: AC | PRN
  Administered 2018-07-27: 20 mL via INTRAVENOUS

## 2018-08-10 ENCOUNTER — Ambulatory Visit: Payer: Self-pay | Admitting: General Surgery

## 2018-08-10 NOTE — H&P (Signed)
History of Present Illness Ralene Ok MD; 08/10/2018 8:57 AM) The patient is a 42 year old female who presents with a complaint of lipoma. 41 year old female comes back in today for follow-up after MRI of her right lower extremity. MRI revealed a superficial subcutaneous mass measuring 21 cm x 5 0.5 cm x 10.1 cm. This appears to be superficial to the muscle layer on my review. Patient states that she's had continued on and off discomfort and pain to that area. She wants to proceed with surgery to have this excised.  -------------------------- Patient is a 42 year old female who comes in and is referred by Dr. Van Clines. Chief complaint right thigh mass  Patient is a 42 year old female who has had a right thigh mass that she states was there since 2012. He states that his gotten larger over this. Time. She states that she has right hip/knee arthritis and has pain from her hip down. She states that with contraction of her thigh muscle she does not have any pain. Patient states that over the last several months she did present to the ER secondary to a insect bite over the mass itself. She states it got red and bruised.  Patient has not had a previous imaging of her right thigh.   Allergies Mammie Lorenzo, LPN; 2/77/8242 3:53 AM) No Known Drug Allergies [07/13/2018]:  Medication History Mammie Lorenzo, LPN; 05/12/4314 4:00 AM) Medications Reconciled    Review of Systems Ralene Ok, MD; 08/10/2018 9:0 AM) General Not Present- Appetite Loss, Chills, Fatigue, Fever, Night Sweats, Weight Gain and Weight Loss. Skin Present- Dryness. Not Present- Change in Wart/Mole, Hives, Jaundice, New Lesions, Non-Healing Wounds, Rash and Ulcer. HEENT Not Present- Earache, Hearing Loss, Hoarseness, Nose Bleed, Oral Ulcers, Ringing in the Ears, Seasonal Allergies, Sinus Pain, Sore Throat, Visual Disturbances, Wears glasses/contact lenses and Yellow Eyes. Respiratory Not Present- Bloody sputum,  Chronic Cough, Difficulty Breathing, Snoring and Wheezing. Breast Not Present- Breast Mass, Breast Pain, Nipple Discharge and Skin Changes. Cardiovascular Not Present- Chest Pain, Difficulty Breathing Lying Down, Leg Cramps, Palpitations, Rapid Heart Rate, Shortness of Breath and Swelling of Extremities. Gastrointestinal Not Present- Abdominal Pain, Bloating, Bloody Stool, Change in Bowel Habits, Chronic diarrhea, Constipation, Difficulty Swallowing, Excessive gas, Gets full quickly at meals, Hemorrhoids, Indigestion, Nausea, Rectal Pain and Vomiting. Female Genitourinary Not Present- Frequency, Nocturia, Painful Urination, Pelvic Pain and Urgency. Musculoskeletal Not Present- Back Pain, Joint Pain, Joint Stiffness, Muscle Pain, Muscle Weakness and Swelling of Extremities. Neurological Not Present- Decreased Memory, Fainting, Headaches, Numbness, Seizures, Tingling, Tremor, Trouble walking and Weakness. Psychiatric Present- Bipolar and Depression. Not Present- Anxiety, Change in Sleep Pattern, Fearful and Frequent crying. Endocrine Not Present- Cold Intolerance, Excessive Hunger, Hair Changes, Heat Intolerance, Hot flashes and New Diabetes. Hematology Not Present- Blood Thinners, Easy Bruising, Excessive bleeding, Gland problems, HIV and Persistent Infections. All other systems negative  Vitals Claiborne Billings Dockery LPN; 8/67/6195 0:93 AM) 08/10/2018 8:49 AM Weight: 286.2 lb Height: 65in Body Surface Area: 2.3 m Body Mass Index: 47.63 kg/m  Temp.: 97.42F(Temporal)  BP: 128/86 (Sitting, Left Arm, Standard)       Physical Exam Ralene Ok, MD; 08/10/2018 9:0 AM) The physical exam findings are as follows: Note: Constitutional: No acute distress, conversant, appears stated age  Eyes: Anicteric sclerae, moist conjunctiva, no lid lag  Neck: No thyromegaly, trachea midline, no cervical lymphadenopathy  Lungs: Clear to auscultation biilaterally, normal respiratory  effot  Cardiovascular: regular rate & rhythm, no murmurs, no peripheal edema, pedal pulses 2+  GI: Soft, no masses  or hepatosplenomegaly, non-tender to palpation  MSK: Normal gait, no clubbing cyanosis, edema right anterior thigh mass, approximately 15 x 15 cm, soft, mobile  Skin: No rashes, palpation reveals normal skin turgor  Psychiatric: Appropriate judgment and insight, oriented to person, place, and time    Assessment & Plan Ralene Ok MD; 08/10/2018 8:58 AM) MASS OF RIGHT THIGH (R22.41) Impression: 42 year old female with a history of hypertension, who comes in with seven-year history of a right anterior thigh mass.  1. Will proceed to the operating room for excision of right lower extremity anterior thigh mass. 2. I discussed with the patient the risks and benefits of the procedure to include but not limited to: Infection, bleeding, damage to structures, possible recurrence, and possible skin dehiscence. The patient was understanding and wishes to proceed.

## 2018-08-10 NOTE — H&P (View-Only) (Signed)
History of Present Illness Betty Ok MD; 08/10/2018 8:57 AM) The patient is a 42 year old female who presents with a complaint of lipoma. 42 year old female comes back in today for follow-up after MRI of her right lower extremity. MRI revealed a superficial subcutaneous mass measuring 21 cm x 5 0.5 cm x 10.1 cm. This appears to be superficial to the muscle layer on my review. Patient states that she's had continued on and off discomfort and pain to that area. She wants to proceed with surgery to have this excised.  -------------------------- Patient is a 42 year old female who comes in and is referred by Dr. Van Clines. Chief complaint right thigh mass  Patient is a 42 year old female who has had a right thigh mass that she states was there since 2012. He states that his gotten larger over this. Time. She states that she has right hip/knee arthritis and has pain from her hip down. She states that with contraction of her thigh muscle she does not have any pain. Patient states that over the last several months she did present to the ER secondary to a insect bite over the mass itself. She states it got red and bruised.  Patient has not had a previous imaging of her right thigh.   Allergies Mammie Lorenzo, LPN; 03/07/8118 1:47 AM) No Known Drug Allergies [07/13/2018]:  Medication History Mammie Lorenzo, LPN; 07/27/5620 3:08 AM) Medications Reconciled    Review of Systems Betty Ok, MD; 08/10/2018 9:0 AM) General Not Present- Appetite Loss, Chills, Fatigue, Fever, Night Sweats, Weight Gain and Weight Loss. Skin Present- Dryness. Not Present- Change in Wart/Mole, Hives, Jaundice, New Lesions, Non-Healing Wounds, Rash and Ulcer. HEENT Not Present- Earache, Hearing Loss, Hoarseness, Nose Bleed, Oral Ulcers, Ringing in the Ears, Seasonal Allergies, Sinus Pain, Sore Throat, Visual Disturbances, Wears glasses/contact lenses and Yellow Eyes. Respiratory Not Present- Bloody sputum,  Chronic Cough, Difficulty Breathing, Snoring and Wheezing. Breast Not Present- Breast Mass, Breast Pain, Nipple Discharge and Skin Changes. Cardiovascular Not Present- Chest Pain, Difficulty Breathing Lying Down, Leg Cramps, Palpitations, Rapid Heart Rate, Shortness of Breath and Swelling of Extremities. Gastrointestinal Not Present- Abdominal Pain, Bloating, Bloody Stool, Change in Bowel Habits, Chronic diarrhea, Constipation, Difficulty Swallowing, Excessive gas, Gets full quickly at meals, Hemorrhoids, Indigestion, Nausea, Rectal Pain and Vomiting. Female Genitourinary Not Present- Frequency, Nocturia, Painful Urination, Pelvic Pain and Urgency. Musculoskeletal Not Present- Back Pain, Joint Pain, Joint Stiffness, Muscle Pain, Muscle Weakness and Swelling of Extremities. Neurological Not Present- Decreased Memory, Fainting, Headaches, Numbness, Seizures, Tingling, Tremor, Trouble walking and Weakness. Psychiatric Present- Bipolar and Depression. Not Present- Anxiety, Change in Sleep Pattern, Fearful and Frequent crying. Endocrine Not Present- Cold Intolerance, Excessive Hunger, Hair Changes, Heat Intolerance, Hot flashes and New Diabetes. Hematology Not Present- Blood Thinners, Easy Bruising, Excessive bleeding, Gland problems, HIV and Persistent Infections. All other systems negative  Vitals Claiborne Billings Dockery LPN; 6/57/8469 6:29 AM) 08/10/2018 8:49 AM Weight: 286.2 lb Height: 65in Body Surface Area: 2.3 m Body Mass Index: 47.63 kg/m  Temp.: 97.67F(Temporal)  BP: 128/86 (Sitting, Left Arm, Standard)       Physical Exam Betty Ok, MD; 08/10/2018 9:0 AM) The physical exam findings are as follows: Note: Constitutional: No acute distress, conversant, appears stated age  Eyes: Anicteric sclerae, moist conjunctiva, no lid lag  Neck: No thyromegaly, trachea midline, no cervical lymphadenopathy  Lungs: Clear to auscultation biilaterally, normal respiratory  effot  Cardiovascular: regular rate & rhythm, no murmurs, no peripheal edema, pedal pulses 2+  GI: Soft, no masses  or hepatosplenomegaly, non-tender to palpation  MSK: Normal gait, no clubbing cyanosis, edema right anterior thigh mass, approximately 15 x 15 cm, soft, mobile  Skin: No rashes, palpation reveals normal skin turgor  Psychiatric: Appropriate judgment and insight, oriented to person, place, and time    Assessment & Plan Betty Ok MD; 08/10/2018 8:58 AM) MASS OF RIGHT THIGH (R22.41) Impression: 42 year old female with a history of hypertension, who comes in with seven-year history of a right anterior thigh mass.  1. Will proceed to the operating room for excision of right lower extremity anterior thigh mass. 2. I discussed with the patient the risks and benefits of the procedure to include but not limited to: Infection, bleeding, damage to structures, possible recurrence, and possible skin dehiscence. The patient was understanding and wishes to proceed.

## 2018-08-12 ENCOUNTER — Encounter (HOSPITAL_COMMUNITY): Payer: Self-pay | Admitting: *Deleted

## 2018-08-12 NOTE — Progress Notes (Addendum)
Denies chest pain or shortness of breath. Reports that in 1994 post appendectomy at at [facility] in [place] patient woke up in ICU and states she was told that they had to do CPR on her after surgery. Attempted to contact medical records at [facility] in [place] with no answer.   States last dose of phentermine last month

## 2018-08-14 MED ORDER — DEXTROSE 5 % IV SOLN
3.0000 g | INTRAVENOUS | Status: DC
  Filled 2018-08-14: qty 3000

## 2018-08-14 NOTE — Progress Notes (Signed)
Anesthesia Chart Review: Betty Perez  Case:  [id number] Date/Time:  08/15/18 0715   Procedure:  EXCISION RIGHT FEMORAL ANTERIOR   MASS (Right )   Anesthesia type:  General   Pre-op diagnosis:  right femoral anterior mass   Location:  [facility] ROOM 09 / [facility]   Surgeon:  Ralene Ok, MD      DISCUSSION: Patient is a 42 year old female scheduled for the above procedure. History includes appendectomy '94 (  Last phentermine dose 08/12/18.   History includes smoking, PTSD, lipoma, HTN, appendectomy/colon surgery '94 ([place]), knee surgery ~ '11 (reportedly done under [place] without complications). She reported that she was told that staff had to do CPR while in or after OR for ruptured appendix in 1994. Her appendix was also adhered to her colon and it sounds like she had to have a small portion of her colon removed as well. She spent about three days in the ICU and about 3 weeks total in the hospital. She does not know any other details about this, but was not told of any concern for anesthesia complication. She had subsequent knee surgery ~ 0454 without complications. She is not aware of any cardiology evaluations, and is not followed by cardiology or pulmonology. She denied any known heart or lung issues. No syncope. She is not terribly active due to "arthritis", but says she can do her day to day activities independently.  Last BMI recorded was 48.8. She will get updated Vitals/HT/WT on the day of surgery.  Reported last phentermine dose last month.   Around age 55 she had a perioperative event at the time of her appendectomy for rupture. It sounded like the hospitalization was prolonged, so question whether she had associated peritonitis or sepsis which could have contributed. She was not told there was an anesthesia concern, but she really couldn't provide may details. Records are from 25 years ago, so doubtful we could get within the next 15 hours--partiularly without signed  release. She did report surgery under general anesthesia around 2011 that was without complication. I have updated anesthesiologist Belinda Block, MD. Anesthesiologist to evaluate on the day of surgery.    PROVIDERS: Antonietta Jewel, MD is listed as PCP   LABS: She will need updates labs prior to surgery.   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   EKG: Last EKG seen is from 01/08/15. She will need an updated EKG prior to surgery if one has not been done within the past year.    CV: None known.   Past Medical History:  Diagnosis Date  . Anemia    d/t miscariage  . Bronchitis   . Complication of anesthesia    during appy reports that CPR had to be performed on her.  . History of blood transfusion   . Hypertension   . Lipoma   . Obesity   . PTSD (post-traumatic stress disorder)     Past Surgical History:  Procedure Laterality Date  . APPENDECTOMY    . COLON SURGERY    . KNEE SURGERY      MEDICATIONS: . Derrill Memo ON 08/15/2018] ceFAZolin (ANCEF) 3 g in dextrose 5 % 50 mL IVPB   . acetaminophen (TYLENOL) 650 MG CR tablet  . atenolol (TENORMIN) 50 MG tablet  . hydrochlorothiazide (HYDRODIURIL) 25 MG tablet  . Multiple Vitamins-Minerals (WOMENS MULTIVITAMIN PO)  . phentermine (ADIPEX-P) 37.5 MG tablet  . vitamin  E 100 UNIT capsule    George [facility] Short Stay Center/Anesthesiology Phone [phone] 08/14/2018 3:55 PM

## 2018-08-14 NOTE — Anesthesia Preprocedure Evaluation (Addendum)
Anesthesia Evaluation  Patient identified by MRN, date of birth, ID band  Reviewed: Allergy & Precautions, NPO status , Patient's Chart, lab work & pertinent test results  History of Anesthesia Complications (+) history of anesthetic complications (CPR during appy in 1994, pt does not have further details, surgeries since without complications)  Airway Mallampati: I  TM Distance: >3 FB Neck ROM: Full    Dental no notable dental hx. (+) Chipped,    Pulmonary Current Smoker,    Pulmonary exam normal breath sounds clear to auscultation       Cardiovascular hypertension, Pt. on medications Normal cardiovascular exam Rhythm:Regular Rate:Normal     Neuro/Psych Anxiety negative neurological ROS     GI/Hepatic negative GI ROS, Neg liver ROS,   Endo/Other  Morbid obesity  Renal/GU negative Renal ROS     Musculoskeletal negative musculoskeletal ROS (+)   Abdominal   Peds  Hematology negative hematology ROS (+)   Anesthesia Other Findings Right anterior thighl mass  Reproductive/Obstetrics                           Anesthesia Physical Anesthesia Plan  ASA: III  Anesthesia Plan: General   Post-op Pain Management:    Induction: Intravenous  PONV Risk Score and Plan: 2 and Midazolam, Dexamethasone and Ondansetron  Airway Management Planned: Oral ETT and LMA  Additional Equipment:   Intra-op Plan:   Post-operative Plan: Extubation in OR  Informed Consent: I have reviewed the patients History and Physical, chart, labs and discussed the procedure including the risks, benefits and alternatives for the proposed anesthesia with the patient or authorized representative who has indicated his/her understanding and acceptance.   Dental advisory given  Plan Discussed with: CRNA  Anesthesia Plan Comments:        Anesthesia Quick Evaluation

## 2018-08-15 ENCOUNTER — Encounter (HOSPITAL_COMMUNITY): Payer: Self-pay | Admitting: Urology

## 2018-08-15 ENCOUNTER — Ambulatory Visit (HOSPITAL_COMMUNITY): Payer: Medicare Other | Admitting: Physician Assistant

## 2018-08-15 ENCOUNTER — Encounter (HOSPITAL_COMMUNITY)
Admission: RE | Disposition: A | Discharge status: Home / Self Care | Payer: Self-pay | Source: Ambulatory Visit | Attending: General Surgery

## 2018-08-15 ENCOUNTER — Ambulatory Visit (HOSPITAL_COMMUNITY)
Admission: RE | Admit: 2018-08-15 | Discharge: 2018-08-15 | Disposition: A | Discharge status: Home / Self Care | Payer: Medicare Other | Source: Ambulatory Visit | Attending: General Surgery | Admitting: General Surgery

## 2018-08-15 DIAGNOSIS — I10 Essential (primary) hypertension: Secondary | ICD-10-CM | POA: Diagnosis not present

## 2018-08-15 DIAGNOSIS — D1723 Benign lipomatous neoplasm of skin and subcutaneous tissue of right leg: Secondary | ICD-10-CM | POA: Insufficient documentation

## 2018-08-15 DIAGNOSIS — F419 Anxiety disorder, unspecified: Secondary | ICD-10-CM | POA: Insufficient documentation

## 2018-08-15 DIAGNOSIS — F17200 Nicotine dependence, unspecified, uncomplicated: Secondary | ICD-10-CM | POA: Insufficient documentation

## 2018-08-15 DIAGNOSIS — Z6841 Body Mass Index (BMI) 40.0 and over, adult: Secondary | ICD-10-CM | POA: Insufficient documentation

## 2018-08-15 HISTORY — DX: Other complications of anesthesia, initial encounter: T88.59XA

## 2018-08-15 HISTORY — PX: MASS EXCISION: SHX2000

## 2018-08-15 HISTORY — DX: Anemia, unspecified: D64.9

## 2018-08-15 HISTORY — DX: Adverse effect of unspecified anesthetic, initial encounter: T41.45XA

## 2018-08-15 HISTORY — DX: Personal history of other medical treatment: Z92.89

## 2018-08-15 LAB — BASIC METABOLIC PANEL
Anion gap: 9 (ref 5–15)
BUN: 15 mg/dL (ref 6–20)
CO2: 25 mmol/L (ref 22–32)
CREATININE: 0.83 mg/dL (ref 0.44–1.00)
Calcium: 9.5 mg/dL (ref 8.9–10.3)
Chloride: 108 mmol/L (ref 98–111)
GFR calc Af Amer: >60 mL/min (ref >60)
GLUCOSE: 112 mg/dL — AB (ref 70–99)
POTASSIUM: 4 mmol/L (ref 3.5–5.1)
SODIUM: 142 mmol/L (ref 135–145)

## 2018-08-15 LAB — CBC
HCT: 42.8 % (ref 36.0–46.0)
Hemoglobin: 13.6 g/dL (ref 12.0–15.0)
MCH: 31.2 pg (ref 26.0–34.0)
MCHC: 31.8 g/dL (ref 30.0–36.0)
MCV: 98.2 fL (ref 78.0–100.0)
PLATELETS: 186 10*3/uL (ref 150–400)
RBC: 4.36 MIL/uL (ref 3.87–5.11)
RDW: 17.8 % — ABNORMAL HIGH (ref 11.5–15.5)
WBC: 8.8 10*3/uL (ref 4.0–10.5)

## 2018-08-15 LAB — POCT PREGNANCY, URINE: Preg Test, Ur: NEGATIVE

## 2018-08-15 SURGERY — EXCISION MASS
Anesthesia: General | Laterality: Right

## 2018-08-15 MED ORDER — TRAMADOL HCL 50 MG PO TABS
50.0000 mg | ORAL_TABLET | Freq: Once | ORAL | Status: AC
  Administered 2018-08-15: 50 mg via ORAL

## 2018-08-15 MED ORDER — CELECOXIB 200 MG PO CAPS
200.0000 mg | ORAL_CAPSULE | ORAL | Status: AC
  Administered 2018-08-15: 200 mg via ORAL
  Filled 2018-08-15: qty 1

## 2018-08-15 MED ORDER — DEXTROSE 5 % IV SOLN
INTRAVENOUS | Status: DC | PRN
  Administered 2018-08-15: 3 g via INTRAVENOUS

## 2018-08-15 MED ORDER — DEXAMETHASONE SODIUM PHOSPHATE 10 MG/ML IJ SOLN
INTRAMUSCULAR | Status: DC | PRN
  Administered 2018-08-15: 10 mg via INTRAVENOUS

## 2018-08-15 MED ORDER — PROPOFOL 10 MG/ML IV BOLUS
INTRAVENOUS | Status: AC
  Filled 2018-08-15: qty 20

## 2018-08-15 MED ORDER — MIDAZOLAM HCL 2 MG/2ML IJ SOLN
INTRAMUSCULAR | Status: AC
  Filled 2018-08-15: qty 2

## 2018-08-15 MED ORDER — FENTANYL CITRATE (PF) 250 MCG/5ML IJ SOLN
INTRAMUSCULAR | Status: AC
  Filled 2018-08-15: qty 5

## 2018-08-15 MED ORDER — ONDANSETRON HCL 4 MG/2ML IJ SOLN
INTRAMUSCULAR | Status: DC | PRN
  Administered 2018-08-15: 4 mg via INTRAVENOUS

## 2018-08-15 MED ORDER — FENTANYL CITRATE (PF) 100 MCG/2ML IJ SOLN
INTRAMUSCULAR | Status: AC
  Filled 2018-08-15: qty 2

## 2018-08-15 MED ORDER — ACETAMINOPHEN 500 MG PO TABS
1000.0000 mg | ORAL_TABLET | ORAL | Status: AC
  Administered 2018-08-15: 1000 mg via ORAL
  Filled 2018-08-15: qty 2

## 2018-08-15 MED ORDER — 0.9 % SODIUM CHLORIDE (POUR BTL) OPTIME
TOPICAL | Status: DC | PRN
  Administered 2018-08-15: 1000 mL

## 2018-08-15 MED ORDER — ACETAMINOPHEN 500 MG PO TABS: 1000.0000 mg | ORAL_TABLET | ORAL | Status: DC

## 2018-08-15 MED ORDER — TRAMADOL HCL 50 MG PO TABS: 50.0000 mg | ORAL_TABLET | Freq: Four times a day (QID) | ORAL | 0 refills | Status: AC | PRN

## 2018-08-15 MED ORDER — CHLORHEXIDINE GLUCONATE CLOTH 2 % EX PADS: 6.0000 | MEDICATED_PAD | Freq: Once | CUTANEOUS | Status: DC

## 2018-08-15 MED ORDER — LIDOCAINE HCL (CARDIAC) PF 100 MG/5ML IV SOSY
PREFILLED_SYRINGE | INTRAVENOUS | Status: DC | PRN
  Administered 2018-08-15: 60 mg via INTRAVENOUS

## 2018-08-15 MED ORDER — LACTATED RINGERS IV SOLN
INTRAVENOUS | Status: DC | PRN
  Administered 2018-08-15 (×2): via INTRAVENOUS

## 2018-08-15 MED ORDER — BUPIVACAINE-EPINEPHRINE 0.25% -1:200000 IJ SOLN
INTRAMUSCULAR | Status: DC | PRN
  Administered 2018-08-15: 10 mL

## 2018-08-15 MED ORDER — FENTANYL CITRATE (PF) 250 MCG/5ML IJ SOLN
INTRAMUSCULAR | Status: DC | PRN
  Administered 2018-08-15 (×3): 50 ug via INTRAVENOUS

## 2018-08-15 MED ORDER — BUPIVACAINE-EPINEPHRINE (PF) 0.25% -1:200000 IJ SOLN
INTRAMUSCULAR | Status: AC
  Filled 2018-08-15: qty 30

## 2018-08-15 MED ORDER — FENTANYL CITRATE (PF) 100 MCG/2ML IJ SOLN
25.0000 ug | INTRAMUSCULAR | Status: DC | PRN
  Administered 2018-08-15: 50 ug via INTRAVENOUS

## 2018-08-15 MED ORDER — GABAPENTIN 300 MG PO CAPS
300.0000 mg | ORAL_CAPSULE | ORAL | Status: AC
  Administered 2018-08-15: 300 mg via ORAL
  Filled 2018-08-15: qty 1

## 2018-08-15 MED ORDER — GLYCOPYRROLATE 0.2 MG/ML IJ SOLN
INTRAMUSCULAR | Status: DC | PRN
  Administered 2018-08-15: 0.2 mg via INTRAVENOUS

## 2018-08-15 MED ORDER — MIDAZOLAM HCL 5 MG/5ML IJ SOLN
INTRAMUSCULAR | Status: DC | PRN
  Administered 2018-08-15: 2 mg via INTRAVENOUS

## 2018-08-15 MED ORDER — TRAMADOL HCL 50 MG PO TABS
ORAL_TABLET | ORAL | Status: AC
  Filled 2018-08-15: qty 1

## 2018-08-15 MED ORDER — PROPOFOL 10 MG/ML IV BOLUS
INTRAVENOUS | Status: DC | PRN
  Administered 2018-08-15: 150 mg via INTRAVENOUS

## 2018-08-15 MED ORDER — PHENYLEPHRINE HCL 10 MG/ML IJ SOLN
INTRAMUSCULAR | Status: DC | PRN
  Administered 2018-08-15: 80 ug via INTRAVENOUS
  Administered 2018-08-15: 120 ug via INTRAVENOUS
  Administered 2018-08-15 (×2): 80 ug via INTRAVENOUS

## 2018-08-15 SURGICAL SUPPLY — 41 items
BANDAGE ACE 6X5 VEL STRL LF (GAUZE/BANDAGES/DRESSINGS) ×2 IMPLANT
BENZOIN TINCTURE PRP APPL 2/3 (GAUZE/BANDAGES/DRESSINGS) ×2 IMPLANT
BLADE CLIPPER SURG (BLADE) IMPLANT
CANISTER SUCT 3000ML PPV (MISCELLANEOUS) IMPLANT
CHLORAPREP W/TINT 26ML (MISCELLANEOUS) ×2 IMPLANT
COVER SURGICAL LIGHT HANDLE (MISCELLANEOUS) ×2 IMPLANT
DERMABOND ADVANCED (GAUZE/BANDAGES/DRESSINGS) ×1
DERMABOND ADVANCED .7 DNX12 (GAUZE/BANDAGES/DRESSINGS) ×1 IMPLANT
DRAPE LAPAROTOMY 100X72 PEDS (DRAPES) IMPLANT
DRAPE ORTHO SPLIT 77X108 STRL (DRAPES)
DRAPE SURG ORHT 6 SPLT 77X108 (DRAPES) IMPLANT
ELECT CAUTERY BLADE 6.4 (BLADE) ×2 IMPLANT
ELECT REM PT RETURN 9FT ADLT (ELECTROSURGICAL) ×2
ELECTRODE REM PT RTRN 9FT ADLT (ELECTROSURGICAL) ×1 IMPLANT
GAUZE SPONGE 4X4 12PLY STRL (GAUZE/BANDAGES/DRESSINGS) ×2 IMPLANT
GLOVE BIO SURGEON STRL SZ7.5 (GLOVE) ×2 IMPLANT
GLOVE BIOGEL PI IND STRL 8 (GLOVE) ×1 IMPLANT
GLOVE BIOGEL PI INDICATOR 8 (GLOVE) ×1
GOWN STRL REUS W/ TWL LRG LVL3 (GOWN DISPOSABLE) ×1 IMPLANT
GOWN STRL REUS W/ TWL XL LVL3 (GOWN DISPOSABLE) ×1 IMPLANT
GOWN STRL REUS W/TWL LRG LVL3 (GOWN DISPOSABLE) ×1
GOWN STRL REUS W/TWL XL LVL3 (GOWN DISPOSABLE) ×1
KIT BASIN OR (CUSTOM PROCEDURE TRAY) ×2 IMPLANT
KIT TURNOVER KIT B (KITS) ×2 IMPLANT
NEEDLE HYPO 25GX1X1/2 BEV (NEEDLE) ×2 IMPLANT
NS IRRIG 1000ML POUR BTL (IV SOLUTION) ×2 IMPLANT
PACK SURGICAL SETUP 50X90 (CUSTOM PROCEDURE TRAY) ×2 IMPLANT
PAD ARMBOARD 7.5X6 YLW CONV (MISCELLANEOUS) ×2 IMPLANT
PENCIL BUTTON HOLSTER BLD 10FT (ELECTRODE) ×2 IMPLANT
SPECIMEN JAR SMALL (MISCELLANEOUS) ×2 IMPLANT
SPONGE LAP 18X18 X RAY DECT (DISPOSABLE) ×2 IMPLANT
STRIP CLOSURE SKIN 1/2X4 (GAUZE/BANDAGES/DRESSINGS) ×2 IMPLANT
SUT MNCRL AB 4-0 PS2 18 (SUTURE) ×2 IMPLANT
SUT VIC AB 3-0 SH 18 (SUTURE) ×2 IMPLANT
SYR BULB 3OZ (MISCELLANEOUS) ×2 IMPLANT
SYR CONTROL 10ML LL (SYRINGE) ×2 IMPLANT
TOWEL OR 17X24 6PK STRL BLUE (TOWEL DISPOSABLE) ×2 IMPLANT
TOWEL OR 17X26 10 PK STRL BLUE (TOWEL DISPOSABLE) ×2 IMPLANT
TUBE CONNECTING 12X1/4 (SUCTIONS) IMPLANT
UNDERPAD 30X30 (UNDERPADS AND DIAPERS) IMPLANT
YANKAUER SUCT BULB TIP NO VENT (SUCTIONS) IMPLANT

## 2018-08-15 NOTE — Discharge Instructions (Signed)
Lipoma Removal, Care After  Refer to this sheet in the next few weeks. These instructions provide you with information about caring for yourself after your procedure. Your health care provider may also give you more specific instructions. Your treatment has been planned according to current medical practices, but problems sometimes occur. Call your health care provider if you have any problems or questions after your procedure.  What can I expect after the procedure?  After the procedure, it is common to have:  · Mild pain.  · Swelling.  · Bruising.    Follow these instructions at home:    Bathing  · Do not take baths, swim, or use a hot tub until your health care provider approves. Ask your health care provider if you can take showers. You may only be allowed to take sponge baths for bathing.  · Keep your bandage (dressing) dry until your health care provider says it can be removed.  Incision care    · Follow instructions from your health care provider about how to take care of your incision. Make sure you:  ? Wash your hands with soap and water before you change your bandage (dressing). If soap and water are not available, use hand sanitizer.  ? Change your dressing as told by your health care provider.  ? Leave stitches (sutures), skin glue, or adhesive strips in place. These skin closures may need to stay in place for 2 weeks or longer. If adhesive strip edges start to loosen and curl up, you may trim the loose edges. Do not remove adhesive strips completely unless your health care provider tells you to do that.  · Check your incision area every day for signs of infection. Check for:  ? More redness, swelling, or pain.  ? Fluid or blood.  ? Warmth.  ? Pus or a bad smell.  Driving  · Do not drive or operate heavy machinery while taking prescription pain medicine.  · Do not drive for 24 hours if you received a medicine to help you relax (sedative) during your procedure.  · Ask your health care provider when it is  safe for you to drive.  General instructions  · Take over-the-counter and prescription medicines only as told by your health care provider.  · Do not use any tobacco products, such as cigarettes, chewing tobacco, and e-cigarettes. These can delay healing. If you need help quitting, ask your health care provider.  · Return to your normal activities as told by your health care provider. Ask your health care provider what activities are safe for you.  · Keep all follow-up visits as told by your health care provider. This is important.  Contact a health care provider if:  · You have more redness, swelling, or pain around your incision.  · You have fluid or blood coming from your incision.  · Your incision feels warm to the touch.  · You have pus or a bad smell coming from your incision.  · You have pain that does not get better with medicine.  Get help right away if:  · You have chills or a fever.  · You have severe pain.  This information is not intended to replace advice given to you by your health care provider. Make sure you discuss any questions you have with your health care provider.  Document Released: 01/29/2016 Document Revised: 04/27/2016 Document Reviewed: 01/29/2016  Elsevier Interactive Patient Education © 2018 [employer]

## 2018-08-15 NOTE — Anesthesia Postprocedure Evaluation (Signed)
Anesthesia Post Note  Patient: [name] [name]  Procedure(s) Performed: EXCISION RIGHT FEMORAL ANTERIOR   MASS (Right )     Patient location during evaluation: PACU Anesthesia Type: General Level of consciousness: awake and alert Pain management: pain level controlled Vital Signs Assessment: post-procedure vital signs reviewed and stable Respiratory status: spontaneous breathing, nonlabored ventilation, respiratory function stable and patient connected to nasal cannula oxygen Cardiovascular status: blood pressure returned to baseline and stable Postop Assessment: no apparent nausea or vomiting Anesthetic complications: no    Last Vitals:  Vitals:   08/15/18 0926 08/15/18 1005  BP: (!) 127/94 (!) 158/88  Pulse: 63 65  Resp:    Temp:  36.5 C  SpO2: 97% 96%    Last Pain:  Vitals:   08/15/18 1005  TempSrc:   PainSc: 0-No pain                 Chelsey L Woodrum

## 2018-08-15 NOTE — Anesthesia Procedure Notes (Signed)
Procedure Name: LMA Insertion Date/Time: 08/15/2018 7:46 AM Performed by: Mariea Clonts, CRNA Pre-anesthesia Checklist: Patient identified, Emergency Drugs available, Suction available and Patient being monitored Patient Re-evaluated:Patient Re-evaluated prior to induction Oxygen Delivery Method: Circle System Utilized Preoxygenation: Pre-oxygenation with 100% oxygen Induction Type: IV induction Ventilation: Mask ventilation without difficulty LMA: LMA inserted LMA Size: 4.0 Number of attempts: 1 Airway Equipment and Method: Bite block Placement Confirmation: positive ETCO2 Tube secured with: Tape Dental Injury: Teeth and Oropharynx as per pre-operative assessment

## 2018-08-15 NOTE — Progress Notes (Signed)
Pt reports she has not taken any of her medications except tylenol since July 03, 2018. Pt has prescriptions for atenolol and hydrochlorothiazide. Pt states she took herself off of these medications because she knew she would need surgery. BP 115/54 and HR in 60s today. Dr. Lanetta Inch notified. Pt educated to contact prescribing doctor regarding these medications on whether to continue taking these medications.

## 2018-08-15 NOTE — Interval H&P Note (Signed)
History and Physical Interval Note:  08/15/2018 7:14 AM  Betty Perez [name]  has presented today for surgery, with the diagnosis of right femoral anterior mass  The various methods of treatment have been discussed with the patient and family. After consideration of risks, benefits and other options for treatment, the patient has consented to  Procedure(s): EXCISION RIGHT FEMORAL ANTERIOR   MASS (Right) as a surgical intervention .  The patient's history has been reviewed, patient examined, no change in status, stable for surgery.  I have reviewed the patient's chart and labs.  Questions were answered to the patient's satisfaction.     Rosario Jacks., Anne Hahn

## 2018-08-15 NOTE — Op Note (Signed)
08/15/2018  8:29 AM  PATIENT:  Betty Perez  42 y.o. female  PRE-OPERATIVE DIAGNOSIS:  right femoral anterior mass  POST-OPERATIVE DIAGNOSIS:  right femoral anterior mass 20x12cm SQ  PROCEDURE:  Procedure(s): EXCISION RIGHT FEMORAL ANTERIOR   MASS (Right)  SURGEON:  Surgeon(s) and Role:    Ralene Ok, MD - Primary  ANESTHESIA:   local and general  EBL:  minimal   BLOOD ADMINISTERED:none  DRAINS: none   LOCAL MEDICATIONS USED:  BUPIVICAINE   SPECIMEN:  Source of Specimen:  Right femoral anterior mass 20x12cm SQ  DISPOSITION OF SPECIMEN:  PATHOLOGY  COUNTS:  YES  TOURNIQUET:  * No tourniquets in log *  DICTATION: .Dragon Dictation  After the patient was consented she was taken to OR and placed in supine position with unilateral SCD in place. She underwent GETA. Patient was prepped and draped in standard fashion. Timeout was called all facts verified.    A 10cm incision was made over the area of the mass. This was dissected down with cautery.  The SQ was dissected away from the mass which was contained to the SQ space.  The mass was then circumferentially dissected away from the surrounding tissues and abutted the muscle fascia. This was excised and measured 12 x 20 cm. This was sent to pathology. Cautery was used to maintain hemostasis. At this time 2-0 Vicryl used to reapproximate the deep dermal layers with an attempt to obliterate the spaces medial and laterally. 3-0 Monocryl was used to reapproximate the skin in a subcuticular fashion. Quarter percent Marcaine with epinephrine was then used and infiltrated around the area of where the mass was removed.The skin was dressed with Dermabond.  An ACE wrap was placed around the area of the mass after the Dermabond was dry. The patient tolerated the procedure well was taken to recovery room stable condition.   PLAN OF CARE: Discharge to home after PACU  PATIENT DISPOSITION:  PACU - hemodynamically  stable.   Delay start of Pharmacological VTE agent (>24hrs) due to surgical blood loss or risk of bleeding: not applicable

## 2018-08-15 NOTE — Transfer of Care (Signed)
Immediate Anesthesia Transfer of Care Note  Patient: Betty Perez Betty Perez  Procedure(s) Performed: EXCISION RIGHT FEMORAL ANTERIOR   MASS (Right )  Patient Location: PACU  Anesthesia Type:General  Level of Consciousness: awake, alert  and oriented  Airway & Oxygen Therapy: Patient Spontanous Breathing and Patient connected to nasal cannula oxygen  Post-op Assessment: Report given to RN, Post -op Vital signs reviewed and stable and Patient moving all extremities X 4  Post vital signs: Reviewed and stable  Last Vitals:  Vitals Value Taken Time  BP 130/98 08/15/2018  8:37 AM  Temp    Pulse 89 08/15/2018  8:39 AM  Resp 25 08/15/2018  8:39 AM  SpO2 93 % 08/15/2018  8:39 AM  Vitals shown include unvalidated device data.  Last Pain:  Vitals:   08/15/18 0653  TempSrc: Oral  PainSc:       Patients Stated Pain Goal: 0 (20/25/42 7062)  Complications: No apparent anesthesia complications

## 2018-08-16 ENCOUNTER — Encounter (HOSPITAL_COMMUNITY): Payer: Self-pay | Admitting: General Surgery

## 2018-08-16 ENCOUNTER — Emergency Department (HOSPITAL_COMMUNITY)
Admission: EM | Admit: 2018-08-16 | Discharge: 2018-08-17 | Disposition: A | Discharge status: Home / Self Care | Payer: Medicare Other | Attending: Emergency Medicine | Admitting: Emergency Medicine

## 2018-08-16 DIAGNOSIS — Y658 Other specified misadventures during surgical and medical care: Secondary | ICD-10-CM | POA: Insufficient documentation

## 2018-08-16 DIAGNOSIS — T8130XA Disruption of wound, unspecified, initial encounter: Secondary | ICD-10-CM

## 2018-08-16 DIAGNOSIS — I10 Essential (primary) hypertension: Secondary | ICD-10-CM | POA: Insufficient documentation

## 2018-08-16 DIAGNOSIS — Z79899 Other long term (current) drug therapy: Secondary | ICD-10-CM | POA: Insufficient documentation

## 2018-08-16 DIAGNOSIS — T8131XA Disruption of external operation (surgical) wound, not elsewhere classified, initial encounter: Secondary | ICD-10-CM | POA: Insufficient documentation

## 2018-08-16 DIAGNOSIS — F17210 Nicotine dependence, cigarettes, uncomplicated: Secondary | ICD-10-CM | POA: Diagnosis not present

## 2018-08-16 NOTE — ED Triage Notes (Signed)
Pt states that she had a tumor removed from her R leg yesterday, pt states that today she was lifting something and incision started draining, pt has dermabond over incision, no redness or swelling

## 2018-08-17 NOTE — Discharge Instructions (Signed)
Continue to keep the wound clean.  Follow-up with the surgeon on this matter.

## 2018-08-17 NOTE — ED Provider Notes (Signed)
[facility] EMERGENCY DEPARTMENT Provider Note   CSN: [id number] Arrival date & time: 08/16/18  2117     History   Chief Complaint Chief Complaint  Patient presents with  . Post-op Problem    HPI Betty Perez is a 42 y.o. female.  HPI   Betty Perez is a 42 y.o. female, presenting to the ED with wound dehiscence.  States she had a tumor removed from the anterior right thigh on 9/17 by Dr. Rosendo Gros.  Evening of 9/18, patient was lifting a case of water when she felt liquid on her leg.  She noted that there was drainage coming from the wound.  She called the surgery office and they told her to come to the ED to have the wound inspected. Denies increased pain, fever, purulent discharge, or any other complaints.     Past Medical History:  Diagnosis Date  . Anemia    d/t miscariage  . Bronchitis   . Complication of anesthesia    during appy reports that CPR had to be performed on her.  . History of blood transfusion   . Hypertension   . Lipoma   . Obesity   . PTSD (post-traumatic stress disorder)     Patient Active Problem List   Diagnosis Date Noted  . Morbid obesity ([place]) 02/09/2018  . Essential hypertension 02/09/2018  . Abnormal uterine bleeding 02/09/2018  . Current every day smoker 02/09/2018    Past Surgical History:  Procedure Laterality Date  . APPENDECTOMY    . COLON SURGERY    . KNEE SURGERY    . MASS EXCISION Right 08/15/2018   Procedure: EXCISION RIGHT FEMORAL ANTERIOR   MASS;  Surgeon: Ralene Ok, MD;  Location: [place];  Service: General;  Laterality: Right;     OB History    Gravida  9   Para  2   Term  1   Preterm  1   AB  7   Living  1     SAB  5   TAB  2   Ectopic      Multiple      Live Births  1            Home Medications    Prior to Admission medications   Medication Sig Start Date End Date Taking? Authorizing Provider  acetaminophen (TYLENOL) 650 MG CR  tablet Take 650 mg by mouth every 8 (eight) hours as needed for pain.    [provider]  atenolol (TENORMIN) 50 MG tablet Take 50 mg by mouth daily.    [provider]  hydrochlorothiazide (HYDRODIURIL) 25 MG tablet Take 25 mg by mouth daily.    [provider]  Multiple Vitamins-Minerals (WOMENS MULTIVITAMIN PO) Take 1 tablet by mouth daily.    [provider]  phentermine (ADIPEX-P) 37.5 MG tablet Take 37.5 mg by mouth daily before breakfast.    [provider]  traMADol (ULTRAM) 50 MG tablet Take 1 tablet (50 mg total) by mouth every 6 (six) hours as needed. 08/15/18 08/15/19  Ralene Ok, MD  vitamin E 100 UNIT capsule Take 100 Units by mouth daily.    [provider]    Family History No family history on file.  Social History Social History   Tobacco Use  . Smoking status: Current Every Day Smoker    Packs/day: 0.50    Types: Cigarettes  . Smokeless tobacco: Never Used  Substance Use Topics  .  Alcohol use: No  . Drug use: No     Allergies   Patient has no known allergies.   Review of Systems Review of Systems  Constitutional: Negative for fever.  Skin:       Wound dehiscence     Physical Exam Updated Vital Signs BP 116/85 (BP Location: Right Arm)   Pulse 65   Temp 98.1 F (36.7 C) (Oral)   Resp 18   SpO2 100%   Physical Exam  Constitutional: She appears well-developed and well-nourished. No distress.  HENT:  Head: Normocephalic and atraumatic.  Eyes: Conjunctivae are normal.  Neck: Neck supple.  Cardiovascular: Normal rate and regular rhythm.  Pulmonary/Chest: Effort normal.  Neurological: She is alert.  Skin: Skin is warm and dry. She is not diaphoretic. No pallor.  Approximately 0.5 cm area of wound dehiscence to the center of the surgical wound on the right anterior thigh.  Edges spontaneously approximate.  No active discharge or hemorrhage.  Unable to express discharge or blood.  Area  nontender.  Psychiatric: She has a normal mood and affect. Her behavior is normal.  Nursing note and vitals reviewed.          ED Treatments / Results  Labs (all labs ordered are listed, but only abnormal results are displayed) Labs Reviewed - No data to display  EKG None  Radiology No results found.  Procedures Procedures (including critical care time)  Medications Ordered in ED Medications - No data to display   Initial Impression / Assessment and Plan / ED Course  I have reviewed the triage vital signs and the nursing notes.  Pertinent labs & imaging results that were available during my care of the patient were reviewed by me and considered in my medical decision making (see chart for details).     Patient presents with a small area of dehiscence of her surgical wound.  No active discharge or hemorrhage.  No increased pain or tenderness.  Wound edges spontaneously approximate.  She is to follow-up with her surgeon. The patient was given instructions for home care as well as return precautions. Patient voices understanding of these instructions, accepts the plan, and is comfortable with discharge.  Final Clinical Impressions(s) / ED Diagnoses   Final diagnoses:  Wound dehiscence    ED Discharge Orders    None       Layla Maw 08/17/18 0138    Ward, Delice Bison, DO 08/17/18 [phone]

## 2020-01-14 DIAGNOSIS — R030 Elevated blood-pressure reading, without diagnosis of hypertension: Secondary | ICD-10-CM | POA: Diagnosis not present

## 2020-01-14 DIAGNOSIS — Z720 Tobacco use: Secondary | ICD-10-CM | POA: Diagnosis not present

## 2020-01-14 DIAGNOSIS — G8929 Other chronic pain: Secondary | ICD-10-CM | POA: Diagnosis not present

## 2020-06-17 DIAGNOSIS — H52223 Regular astigmatism, bilateral: Secondary | ICD-10-CM | POA: Diagnosis not present

## 2020-06-17 DIAGNOSIS — H5213 Myopia, bilateral: Secondary | ICD-10-CM | POA: Diagnosis not present

## 2021-01-17 ENCOUNTER — Emergency Department (HOSPITAL_COMMUNITY)
Admission: EM | Admit: 2021-01-17 | Discharge: 2021-01-18 | Disposition: A | Discharge status: Home / Self Care | Payer: Medicare Other | Attending: Emergency Medicine | Admitting: Emergency Medicine

## 2021-01-17 ENCOUNTER — Other Ambulatory Visit: Payer: Self-pay

## 2021-01-17 ENCOUNTER — Encounter (HOSPITAL_COMMUNITY): Payer: Self-pay | Admitting: *Deleted

## 2021-01-17 DIAGNOSIS — K0889 Other specified disorders of teeth and supporting structures: Secondary | ICD-10-CM | POA: Diagnosis present

## 2021-01-17 DIAGNOSIS — I10 Essential (primary) hypertension: Secondary | ICD-10-CM | POA: Insufficient documentation

## 2021-01-17 DIAGNOSIS — F17210 Nicotine dependence, cigarettes, uncomplicated: Secondary | ICD-10-CM | POA: Insufficient documentation

## 2021-01-17 DIAGNOSIS — K047 Periapical abscess without sinus: Secondary | ICD-10-CM | POA: Diagnosis not present

## 2021-01-17 DIAGNOSIS — Z79899 Other long term (current) drug therapy: Secondary | ICD-10-CM | POA: Insufficient documentation

## 2021-01-17 NOTE — ED Triage Notes (Signed)
Lower dental pain and facial swelling for about a week. Has had a temp, has not taken any medications for the same.

## 2021-01-18 DIAGNOSIS — K047 Periapical abscess without sinus: Secondary | ICD-10-CM | POA: Diagnosis not present

## 2021-01-18 MED ORDER — PENICILLIN V POTASSIUM 500 MG PO TABS: 500.0000 mg | ORAL_TABLET | Freq: Four times a day (QID) | ORAL | 0 refills | Status: DC

## 2021-01-18 MED ORDER — NAPROXEN 250 MG PO TABS
500.0000 mg | ORAL_TABLET | Freq: Once | ORAL | Status: AC
  Administered 2021-01-18: 500 mg via ORAL
  Filled 2021-01-18: qty 2

## 2021-01-18 MED ORDER — NAPROXEN 500 MG PO TABS: 500.0000 mg | ORAL_TABLET | Freq: Two times a day (BID) | ORAL | 0 refills | Status: DC

## 2021-01-18 MED ORDER — PENICILLIN V POTASSIUM 250 MG PO TABS
500.0000 mg | ORAL_TABLET | Freq: Once | ORAL | Status: AC
  Administered 2021-01-18: 500 mg via ORAL
  Filled 2021-01-18: qty 2

## 2021-01-18 NOTE — ED Notes (Signed)
Pt ambulated to H015C. Pt reports dental/gum pain x couple of days. Vitals taken. NAD. Respirations regular/unlabored. MD at bedside.

## 2021-01-18 NOTE — Discharge Instructions (Signed)
Take the prescribed medication as directed. Follow-up with dentist as soon as you can.  Dr. Haig Prophet is on call or you find other clinic in your own. Return to the ED for new or worsening symptoms.

## 2021-01-18 NOTE — ED Provider Notes (Signed)
[facility] EMERGENCY DEPARTMENT Provider Note   CSN: [id number] Arrival date & time: 01/17/21  1819     History Chief Complaint  Patient presents with  . Dental Pain    Betty Perez is a 45 y.o. female.  The history is provided by the patient and medical records.   46 year old female with history of anemia, hypertension, obesity, PTSD, presenting to the ED with left lower dental pain.  States she feels like she has a dental abscess.  She woke up yesterday morning with swelling along the left lower jaw.  This is decreased some since onset but still feels larger than normal.  She has not had any difficulty swallowing.  Denies any fever or chills.  She does not currently have a dentist.  She has not tried any medications prior to arrival.  Past Medical History:  Diagnosis Date  . Anemia    d/t miscariage  . Bronchitis   . Complication of anesthesia    during appy reports that CPR had to be performed on her.  . History of blood transfusion   . Hypertension   . Lipoma   . Obesity   . PTSD (post-traumatic stress disorder)     Patient Active Problem List   Diagnosis Date Noted  . Morbid obesity ([place]) 02/09/2018  . Essential hypertension 02/09/2018  . Abnormal uterine bleeding 02/09/2018  . Current every day smoker 02/09/2018    Past Surgical History:  Procedure Laterality Date  . APPENDECTOMY    . COLON SURGERY    . KNEE SURGERY    . MASS EXCISION Right 08/15/2018   Procedure: EXCISION RIGHT FEMORAL ANTERIOR   MASS;  Surgeon: Ralene Ok, MD;  Location: [place];  Service: General;  Laterality: Right;     OB History    Gravida  9   Para  2   Term  1   Preterm  1   AB  7   Living  1     SAB  5   IAB  2   Ectopic      Multiple      Live Births  1           No family history on file.  Social History   Tobacco Use  . Smoking status: Current Every Day Smoker    Packs/day: 0.50    Types: Cigarettes  .  Smokeless tobacco: Never Used  Vaping Use  . Vaping Use: Never used  Substance Use Topics  . Alcohol use: No  . Drug use: No    Home Medications Prior to Admission medications   Medication Sig Start Date End Date Taking? Authorizing Provider  acetaminophen (TYLENOL) 650 MG CR tablet Take 650 mg by mouth every 8 (eight) hours as needed for pain.    [provider]  atenolol (TENORMIN) 50 MG tablet Take 50 mg by mouth daily.    [provider]  hydrochlorothiazide (HYDRODIURIL) 25 MG tablet Take 25 mg by mouth daily.    [provider]  Multiple Vitamins-Minerals (WOMENS MULTIVITAMIN PO) Take 1 tablet by mouth daily.    [provider]  phentermine (ADIPEX-P) 37.5 MG tablet Take 37.5 mg by mouth daily before breakfast.    [provider]  vitamin E 100 UNIT capsule Take 100 Units by mouth daily.    [provider]    Allergies    Patient has no known allergies.  Review of Systems   Review of Systems  HENT: Positive for dental problem.   All other systems reviewed and are negative.   Physical Exam Updated Vital Signs BP 122/85 (BP Location: Left Arm)   Pulse 99   Temp 98.5 F (36.9 C) (Oral)   Resp 18   SpO2 100%   Physical Exam Vitals and nursing note reviewed.  Constitutional:      Appearance: She is well-developed and well-nourished.  HENT:     Head: Normocephalic and atraumatic.     Comments: Teeth largely in  dentition, left lower gingiva is swollen along the canines and premolars, teeth broken with decay, handling secretions appropriately, no trismus, no facial or neck swelling, normal phonation without stridor    Mouth/Throat:     Mouth: Oropharynx is clear and moist.  Eyes:     Extraocular Movements: EOM normal.     Conjunctiva/sclera: Conjunctivae normal.     Pupils: Pupils are equal, round, and reactive to light.  Cardiovascular:     Rate and Rhythm: Normal rate and regular rhythm.     Heart sounds:  Normal heart sounds.  Pulmonary:     Effort: Pulmonary effort is normal.     Breath sounds: Normal breath sounds.  Abdominal:     General: Bowel sounds are normal.     Palpations: Abdomen is soft.  Musculoskeletal:        General: Normal range of motion.     Cervical back: Normal range of motion.  Skin:    General: Skin is warm and dry.  Neurological:     Mental Status: She is alert and oriented to person, place, and time.  Psychiatric:        Mood and Affect: Mood and affect normal.     ED Results / Procedures / Treatments   Labs (all labs ordered are listed, but only abnormal results are displayed) Labs Reviewed - No data to display  EKG None  Radiology No results found.  Procedures Procedures   Medications Ordered in ED Medications - No data to display  ED Course  I have reviewed the triage vital signs and the nursing notes.  Pertinent labs & imaging results that were available during my care of the patient were reviewed by me and considered in my medical decision making (see chart for details).    MDM Rules/Calculators/A&P  45 y.o. F here with dental pain and swelling.  Began yesterday upon waking.  She is afebrile, non-toxic.  Does have some swelling along left lower gingiva with broken/decayed teeth.  I do not appreciate discrete abscess.  Handling secretions well, no stridor. No signs/symptoms suggestive of ludwig's angina.  Will start on abx and refer to dentist for follow-up.  Return here for any new/acute changes.    Final Clinical Impression(s) / ED Diagnoses Final diagnoses:  Dental infection    Rx / DC Orders ED Discharge Orders         Ordered    penicillin v potassium (VEETID) 500 MG tablet  4 times daily        01/18/21 0343    naproxen (NAPROSYN) 500 MG tablet  2 times daily with meals        01/18/21 0343           Larene Pickett, PA-C 01/18/21 0403    Breck Coons, MD 01/18/21 [phone]

## 2021-02-28 ENCOUNTER — Emergency Department (HOSPITAL_COMMUNITY)
Admission: EM | Admit: 2021-02-28 | Discharge: 2021-02-28 | Disposition: A | Discharge status: Home / Self Care | Payer: Medicare Other | Attending: Emergency Medicine | Admitting: Emergency Medicine

## 2021-02-28 ENCOUNTER — Emergency Department (HOSPITAL_COMMUNITY): Payer: Medicare Other

## 2021-02-28 DIAGNOSIS — Y9389 Activity, other specified: Secondary | ICD-10-CM | POA: Insufficient documentation

## 2021-02-28 DIAGNOSIS — F1290 Cannabis use, unspecified, uncomplicated: Secondary | ICD-10-CM | POA: Insufficient documentation

## 2021-02-28 DIAGNOSIS — Z23 Encounter for immunization: Secondary | ICD-10-CM | POA: Insufficient documentation

## 2021-02-28 DIAGNOSIS — I10 Essential (primary) hypertension: Secondary | ICD-10-CM | POA: Insufficient documentation

## 2021-02-28 DIAGNOSIS — F17210 Nicotine dependence, cigarettes, uncomplicated: Secondary | ICD-10-CM | POA: Insufficient documentation

## 2021-02-28 DIAGNOSIS — Y92000 Kitchen of unspecified non-institutional (private) residence as  the place of occurrence of the external cause: Secondary | ICD-10-CM | POA: Insufficient documentation

## 2021-02-28 DIAGNOSIS — W260XXA Contact with knife, initial encounter: Secondary | ICD-10-CM | POA: Diagnosis not present

## 2021-02-28 DIAGNOSIS — F1099 Alcohol use, unspecified with unspecified alcohol-induced disorder: Secondary | ICD-10-CM | POA: Insufficient documentation

## 2021-02-28 DIAGNOSIS — S61511A Laceration without foreign body of right wrist, initial encounter: Secondary | ICD-10-CM

## 2021-02-28 MED ORDER — KETOROLAC TROMETHAMINE 30 MG/ML IJ SOLN
30.0000 mg | Freq: Once | INTRAMUSCULAR | Status: AC
  Administered 2021-02-28: 30 mg via INTRAMUSCULAR
  Filled 2021-02-28: qty 1

## 2021-02-28 MED ORDER — TETANUS-DIPHTH-ACELL PERTUSSIS 5-2.5-18.5 LF-MCG/0.5 IM SUSY
0.5000 mL | PREFILLED_SYRINGE | Freq: Once | INTRAMUSCULAR | Status: AC
  Administered 2021-02-28: 0.5 mL via INTRAMUSCULAR
  Filled 2021-02-28: qty 0.5
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
## 2021-02-28 NOTE — Discharge Instructions (Addendum)
Thank you for allowing me to care for you today in the Emergency Department.   Keep the wound clean and dry for the next 24 hours.  After that time, you can clean the area with soap and water.  Take 650 mg of Tylenol or 600 mg of ibuprofen with food every 6 hours for pain.  You can alternate between these 2 medications every 3 hours if your pain returns.  For instance, you can take Tylenol at noon, followed by a dose of ibuprofen at 3, followed by second dose of Tylenol and 6.  Your tetanus immunization was updated today and is good for the next 10 years.  Return to the emergency department if you develop fevers (temperature greater than 100.4 F), red streaking up the arm, redness, swelling, or thick, mucus-like drainage from the wound, or other new, concerning symptoms.
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
## 2023-01-04 ENCOUNTER — Other Ambulatory Visit: Payer: Self-pay | Admitting: Internal Medicine

## 2023-01-04 DIAGNOSIS — Z1231 Encounter for screening mammogram for malignant neoplasm of breast: Secondary | ICD-10-CM

## 2023-01-20 ENCOUNTER — Ambulatory Visit (HOSPITAL_COMMUNITY)
Admission: EM | Admit: 2023-01-20 | Discharge: 2023-01-20 | Disposition: A | Discharge status: Home / Self Care | Payer: 59 | Attending: Internal Medicine | Admitting: Internal Medicine

## 2023-01-20 ENCOUNTER — Encounter (HOSPITAL_COMMUNITY): Payer: Self-pay | Admitting: *Deleted

## 2023-01-20 DIAGNOSIS — T7840XA Allergy, unspecified, initial encounter: Secondary | ICD-10-CM | POA: Diagnosis not present

## 2023-01-20 DIAGNOSIS — L500 Allergic urticaria: Secondary | ICD-10-CM

## 2023-01-20 MED ORDER — PREDNISONE 20 MG PO TABS: 40.0000 mg | ORAL_TABLET | Freq: Every day | ORAL | 0 refills | Status: DC

## 2023-01-20 MED ORDER — DIPHENHYDRAMINE HCL 50 MG/ML IJ SOLN
INTRAMUSCULAR | Status: AC
  Filled 2023-01-20: qty 1

## 2023-01-20 MED ORDER — FAMOTIDINE 20 MG PO TABS
ORAL_TABLET | ORAL | Status: AC
  Filled 2023-01-20: qty 1

## 2023-01-20 MED ORDER — DIPHENHYDRAMINE HCL 50 MG/ML IJ SOLN: 25.0000 mg | Freq: Once | INTRAMUSCULAR | Status: DC

## 2023-01-20 MED ORDER — PREDNISONE 10 MG PO TABS: ORAL_TABLET | ORAL | 0 refills | Status: AC

## 2023-01-20 MED ORDER — FAMOTIDINE 20 MG PO TABS
20.0000 mg | ORAL_TABLET | Freq: Once | ORAL | Status: AC
  Administered 2023-01-20: 20 mg via ORAL

## 2023-01-20 MED ORDER — DEXAMETHASONE SODIUM PHOSPHATE 10 MG/ML IJ SOLN
10.0000 mg | Freq: Once | INTRAMUSCULAR | Status: AC
  Administered 2023-01-20: 10 mg via INTRAMUSCULAR

## 2023-01-20 MED ORDER — HYDROXYZINE HCL 25 MG PO TABS: 25.0000 mg | ORAL_TABLET | Freq: Four times a day (QID) | ORAL | 0 refills | Status: DC

## 2023-01-20 MED ORDER — DEXAMETHASONE SODIUM PHOSPHATE 10 MG/ML IJ SOLN
INTRAMUSCULAR | Status: AC
  Filled 2023-01-20: qty 1

## 2023-01-20 MED ORDER — DIPHENHYDRAMINE HCL 50 MG/ML IJ SOLN
25.0000 mg | Freq: Once | INTRAMUSCULAR | Status: AC
  Administered 2023-01-20: 25 mg via INTRAMUSCULAR

## 2023-01-20 NOTE — Discharge Instructions (Addendum)
You have been evaluated today for an allergic reaction. We gave you a steroid in the clinic to help with your symptoms.   You have also been given a prescription for steroid, please take them as directed starting tomorrow. Do not take any ibuprofen when taking the steroid and take the steroid with food to avoid stomach upset.  I gave you a shot of benadryl in the clinic to help with the itching further.  Start taking hydroxyzine tomorrow. Do not take any benadryl when you take the hydroxyzine as this is a similar medicine.   Please schedule an appointment with your primary care provider for follow-up and ongoing management. Return if you experience rashes, difficulty breathing or swallowing, lip/mouth/tongue swelling, vomiting, or for any other concerning symptoms. If symptoms are severe, please go to the ER for further workup. I hope you feel better!

## 2023-01-20 NOTE — ED Triage Notes (Signed)
Pt states hives started Saturday and has got worse she has been taking benadryl last dose was 9:30pm. She is doing oatmeal baths, and cold baths. Pt states she started new meds on 12/31/22 but stopped them and restarted on Sunday.

## 2023-01-20 NOTE — ED Provider Notes (Signed)
[facility]    CSN: [id number] Arrival date & time: 01/20/23  [id number]      History   Chief Complaint Chief Complaint  Patient presents with   Urticaria    HPI Betty Perez is a 47 y.o. female.   Patient presents to urgent care for evaluation of generalized intensely pruritic urticarial rash to the generalized chest, abdomen, back, legs, arms, and neck that initially started 4 days ago on Saturday, January 15, 2023 and improved initially, however has worsened in the last 2 days. Patient started bactrim antibiotic on December 31, 2022 and states she had never taken this antibiotic in the past. No history of allergies to antibiotics or medications. She was placed on Bactrim twice daily for 10 days for "urinary symptoms and a few other things" that patient cannot remember at this time. She took the antibiotic as directed for a few days, but has missed multiple days between December 31, 2022 and yesterday (January 19, 2023) when she took her last dose. She noticed faint urticaria to the body on February 17th and only took 1 dose of the antibiotic that day due to concern for allergy, but re-started the next day on Sunday the 18th.  No recent new changes to laundry detergent, personal hygiene products, or make-ups.  She does state that she used a new hair rinse product in her hair recently, however she is not experiencing itching to the scalp and has used a similar hair product in the past without allergic reaction.  No family history of sulfa allergy.  Currently experiencing intense pruritus to the entire body with erythematous urticarial rash that spares the face.  No sensation of throat swelling, sore throat, shortness of breath, chest pain, heart palpitations, extremity weakness, or difficulty ambulating.  No history of anaphylactic allergy.  She has been intermittently using Benadryl at home to help with symptoms.  Last dose of Benadryl was last  night.   Urticaria    Past Medical History:  Diagnosis Date   Anemia    d/t miscariage   Bronchitis    Complication of anesthesia    during appy reports that CPR had to be performed on her.   History of blood transfusion    Hypertension    Lipoma    Obesity    PTSD (post-traumatic stress disorder)     Patient Active Problem List   Diagnosis Date Noted   Morbid obesity ([place]) 02/09/2018   Essential hypertension 02/09/2018   Abnormal uterine bleeding 02/09/2018   Current every day smoker 02/09/2018    Past Surgical History:  Procedure Laterality Date   APPENDECTOMY     COLON SURGERY     KNEE SURGERY     MASS EXCISION Right 08/15/2018   Procedure: EXCISION RIGHT FEMORAL ANTERIOR   MASS;  Surgeon: Ralene Ok, MD;  Location: [facility];  Service: General;  Laterality: Right;    OB History     Gravida  9   Para  2   Term  1   Preterm  1   AB  7   Living  1      SAB  5   IAB  2   Ectopic      Multiple      Live Births  1            Home Medications    Prior to Admission medications   Medication Sig Start Date End Date Taking? Authorizing Provider  hydrOXYzine (ATARAX) 25 MG  tablet Take 1 tablet (25 mg total) by mouth every 6 (six) hours. 01/20/23  Yes Talbot Grumbling, FNP  meloxicam (MOBIC) 15 MG tablet Take 15 mg by mouth daily. 12/31/22  Yes [provider]  omeprazole (PRILOSEC) 20 MG capsule Take 20 mg by mouth daily. 12/31/22  Yes [provider]  oxybutynin (DITROPAN) 5 MG tablet Take 5 mg by mouth daily. 12/31/22  Yes [provider]  phentermine (ADIPEX-P) 37.5 MG tablet Take 37.5 mg by mouth every morning. 12/31/22  Yes [provider]  sulfamethoxazole-trimethoprim (BACTRIM DS) 800-160 MG tablet Take 1 tablet by mouth 2 (two) times daily. 12/31/22  Yes [provider]  acetaminophen (TYLENOL) 650 MG CR tablet Take 650 mg by mouth every 8 (eight) hours as needed for pain.    [provider]  Cholecalciferol (DIALYVITE VITAMIN D 5000) 125 MCG (5000 UT) capsule Take 5,000 Units by mouth daily.    [provider]  docusate sodium (COLACE) 100 MG capsule Take 100 mg by mouth daily.    [provider]  folic acid (FOLVITE) Q000111Q MCG tablet Take 400 mcg by mouth daily.    [provider]  GLUCOSAMINE-CHONDROITIN PO Take 1 capsule by mouth daily.    [provider]  naproxen (NAPROSYN) 500 MG tablet Take 1 tablet (500 mg total) by mouth 2 (two) times daily with a meal. 01/18/21   Larene Pickett, PA-C  penicillin v potassium (VEETID) 500 MG tablet Take 1 tablet (500 mg total) by mouth 4 (four) times daily. 01/18/21   Larene Pickett, PA-C  predniSONE (DELTASONE) 10 MG tablet Take 4 tablets (40 mg total) by mouth daily for 3 days, THEN 2 tablets (20 mg total) daily for 3 days, THEN 1 tablet (10 mg total) daily for 3 days. 01/20/23 01/29/23  Talbot Grumbling, FNP  Vitamin A 2400 MCG (8000 UT) CAPS Take 2,400 mcg by mouth daily.    [provider]  vitamin B-12 (CYANOCOBALAMIN) 1000 MCG tablet Take 1,000 mcg by mouth daily.    [provider]    Family History History reviewed. No pertinent family history.  Social History Social History   Tobacco Use   Smoking status: Every Day    Packs/day: 0.50    Types: Cigarettes   Smokeless tobacco: Never  Vaping Use   Vaping Use: Never used  Substance Use Topics   Alcohol use: No   Drug use: No     Allergies   Patient has no known allergies.   Review of Systems Review of Systems Per HPI  Physical Exam Triage Vital Signs ED Triage Vitals  Enc Vitals Group     BP 01/20/23 1058 125/84     Pulse Rate 01/20/23 1058 (!) 113     Resp 01/20/23 1058 20     Temp 01/20/23 1058 97.6 F (36.4 C)     Temp Source 01/20/23 1058 Oral     SpO2 01/20/23 1058 97 %     Weight --      Height --      Head Circumference --      Peak Flow --      Pain Score 01/20/23 1054 10     Pain Loc --       Pain Edu? --      Excl. in [place]? --    No data found.  Updated Vital Signs BP 125/84 (BP Location: Right Arm)   Pulse (!) 113   Temp 97.6  F (36.4 C) (Oral)   Resp 20   LMP 04/26/2018   SpO2 97%   Visual Acuity Right Eye Distance:   Left Eye Distance:   Bilateral Distance:    Right Eye Near:   Left Eye Near:    Bilateral Near:     Physical Exam Vitals and nursing note reviewed.  Constitutional:      Appearance: She is not ill-appearing or toxic-appearing.     Comments: Patient appears to be very uncomfortable and is constantly itching her rash due to intense pruritus.  HENT:     Head: Normocephalic and atraumatic.     Right Ear: Hearing, tympanic membrane, ear canal and external ear normal.     Left Ear: Hearing, tympanic membrane, ear canal and external ear normal.     Nose: Nose normal.     Mouth/Throat:     Lips: Pink.     Mouth: Mucous membranes are moist. No injury.     Tongue: No lesions. Tongue does not deviate from midline.     Palate: No mass and lesions.     Pharynx: Oropharynx is clear. Uvula midline. No pharyngeal swelling, oropharyngeal exudate, posterior oropharyngeal erythema or uvula swelling.     Tonsils: No tonsillar exudate or tonsillar abscesses.  Eyes:     General: Lids are normal. Vision grossly intact. Gaze aligned appropriately.     Extraocular Movements: Extraocular movements intact.     Conjunctiva/sclera: Conjunctivae normal.  Cardiovascular:     Rate and Rhythm: Normal rate and regular rhythm.     Heart sounds: Normal heart sounds, S1 normal and S2 normal.  Pulmonary:     Effort: Pulmonary effort is normal. No respiratory distress.     Breath sounds: Normal breath sounds and air entry.  Musculoskeletal:     Cervical back: Neck supple.  Skin:    General: Skin is warm and dry.     Capillary Refill: Capillary refill takes less than 2 seconds.     Findings: Rash present.     Comments: Diffuse intensely pruritic raised erythematous  urticarial rash to the chest, trunk, back, bilateral lower extremities, bilateral upper extremities, and neck.  Neurological:     General: No focal deficit present.     Mental Status: She is alert and oriented to person, place, and time. Mental status is at baseline.     Cranial Nerves: No dysarthria or facial asymmetry.  Psychiatric:        Mood and Affect: Mood normal.        Speech: Speech normal.        Behavior: Behavior normal.        Thought Content: Thought content normal.        Judgment: Judgment normal.      UC Treatments / Results  Labs (all labs ordered are listed, but only abnormal results are displayed) Labs Reviewed - No data to display  EKG   Radiology No results found.  Procedures Procedures (including critical care time)  Medications Ordered in UC Medications  dexamethasone (DECADRON) injection 10 mg (10 mg Intramuscular Given 01/20/23 1100)  famotidine (PEPCID) tablet 20 mg (20 mg Oral Given 01/20/23 1100)  diphenhydrAMINE (BENADRYL) injection 25 mg (25 mg Intramuscular Given 01/20/23 1152)    Initial Impression / Assessment and Plan / UC Course  I have reviewed the triage vital signs and the nursing notes.  Pertinent labs & imaging results that were available during my care of the patient were reviewed by me and considered  in my medical decision making (see chart for details).   1.  Allergic reaction due to drug, allergic urticaria Symptoms and physical exam are consistent with acute allergic reaction to likely Bactrim antibiotic.  Sulfa allergy documented in patient's chart.  Patient given dexamethasone 10 mg IM, Pepcid 20 mg orally, and Benadryl 25 mg IM in clinic for allergic reaction/urticaria relief.  HEENT exam is stable.  Deferred imaging based on stable cardiopulmonary exam and hemodynamically stable vital signs.  No signs of anaphylaxis.  She appears to be extremely uncomfortable.  Patient to start taking 9-day steroid taper tomorrow, advised no  NSAIDs while taking steroid taper.  Advised to take this with food to avoid stomach upset.  She may also start hydroxyzine tomorrow to be used as needed for intense itching.  Advised to avoid using other antihistamine medications including Benadryl while using hydroxyzine, drowsiness precautions discussed as well.  If symptoms fail to improve, or if she develops any new or worsening symptoms over the next 12 to 24 hours, I have advised patient to return to urgent care or go to the nearest emergency department for severe symptoms.  She is agreeable with this plan.  Discussed physical exam and available lab work findings in clinic with patient.  Counseled patient regarding appropriate use of medications and potential side effects for all medications recommended or prescribed today. Discussed red flag signs and symptoms of worsening condition,when to call the PCP office, return to urgent care, and when to seek higher level of care in the emergency department. Patient verbalizes understanding and agreement with plan. All questions answered. Patient discharged in stable condition.    Final Clinical Impressions(s) / UC Diagnoses   Final diagnoses:  Allergic reaction to drug, initial encounter  Allergic urticaria     Discharge Instructions      You have been evaluated today for an allergic reaction. We gave you a steroid in the clinic to help with your symptoms.   You have also been given a prescription for steroid, please take them as directed starting tomorrow. Do not take any ibuprofen when taking the steroid and take the steroid with food to avoid stomach upset.  I gave you a shot of benadryl in the clinic to help with the itching further.  Start taking hydroxyzine tomorrow. Do not take any benadryl when you take the hydroxyzine as this is a similar medicine.   Please schedule an appointment with your primary care provider for follow-up and ongoing management. Return if you experience rashes,  difficulty breathing or swallowing, lip/mouth/tongue swelling, vomiting, or for any other concerning symptoms. If symptoms are severe, please go to the ER for further workup. I hope you feel better!      ED Prescriptions     Medication Sig Dispense Auth. Provider   predniSONE (DELTASONE) 20 MG tablet  (Status: Discontinued) Take 2 tablets (40 mg total) by mouth daily for 5 days. 10 tablet Betty Prince M, FNP   predniSONE (DELTASONE) 10 MG tablet Take 4 tablets (40 mg total) by mouth daily for 3 days, THEN 2 tablets (20 mg total) daily for 3 days, THEN 1 tablet (10 mg total) daily for 3 days. 21 tablet Talbot Grumbling, FNP   hydrOXYzine (ATARAX) 25 MG tablet Take 1 tablet (25 mg total) by mouth every 6 (six) hours. 12 tablet Talbot Grumbling, FNP      PDMP not reviewed this encounter.   Talbot Grumbling, [place] 01/20/23 1159

## 2023-05-05 ENCOUNTER — Other Ambulatory Visit: Payer: Self-pay | Admitting: Internal Medicine

## 2023-05-05 DIAGNOSIS — Z1231 Encounter for screening mammogram for malignant neoplasm of breast: Secondary | ICD-10-CM

## 2023-05-20 ENCOUNTER — Ambulatory Visit
Admission: RE | Admit: 2023-05-20 | Discharge: 2023-05-20 | Disposition: A | Discharge status: Home / Self Care | Payer: 59 | Source: Ambulatory Visit | Attending: Internal Medicine | Admitting: Internal Medicine

## 2023-05-20 DIAGNOSIS — Z1231 Encounter for screening mammogram for malignant neoplasm of breast: Secondary | ICD-10-CM

## 2023-05-25 ENCOUNTER — Other Ambulatory Visit: Payer: Self-pay | Admitting: Internal Medicine

## 2023-05-25 DIAGNOSIS — R928 Other abnormal and inconclusive findings on diagnostic imaging of breast: Secondary | ICD-10-CM

## 2023-06-01 ENCOUNTER — Ambulatory Visit
Admission: RE | Admit: 2023-06-01 | Discharge: 2023-06-01 | Disposition: A | Discharge status: Home / Self Care | Payer: 59 | Source: Ambulatory Visit | Attending: Internal Medicine | Admitting: Internal Medicine

## 2023-06-01 DIAGNOSIS — R928 Other abnormal and inconclusive findings on diagnostic imaging of breast: Secondary | ICD-10-CM

## 2023-07-24 ENCOUNTER — Ambulatory Visit (HOSPITAL_COMMUNITY)
Admission: EM | Admit: 2023-07-24 | Discharge: 2023-07-24 | Disposition: A | Discharge status: Home / Self Care | Payer: 59 | Attending: Physician Assistant | Admitting: Physician Assistant

## 2023-07-24 ENCOUNTER — Other Ambulatory Visit: Payer: Self-pay

## 2023-07-24 ENCOUNTER — Encounter (HOSPITAL_COMMUNITY): Payer: Self-pay | Admitting: Emergency Medicine

## 2023-07-24 DIAGNOSIS — T300 Burn of unspecified body region, unspecified degree: Secondary | ICD-10-CM | POA: Diagnosis not present

## 2023-07-24 MED ORDER — SILVER SULFADIAZINE 1 % EX CREA: 1.0000 | TOPICAL_CREAM | Freq: Every day | CUTANEOUS | 0 refills | Status: DC

## 2023-07-24 NOTE — ED Provider Notes (Signed)
[facility]    CSN: [id number] Arrival date & time: 07/24/23  1634      History   Chief Complaint Chief Complaint  Patient presents with   Burn    HPI Betty Perez is a 47 y.o. female.   Patient reports with a burn to the back of her left arm and left shoulder she reports earlier today she fell in the kitchen and caught herself hitting her arm and back on the stove.  She has tried nothing for the symptoms.  Has a small area of blisters on the left upper back.  No other injuries at this time.    Past Medical History:  Diagnosis Date   Anemia    d/t miscariage   Bronchitis    Complication of anesthesia    during appy reports that CPR had to be performed on her.   History of blood transfusion    Hypertension    Lipoma    Obesity    PTSD (post-traumatic stress disorder)     Patient Active Problem List   Diagnosis Date Noted   Morbid obesity ([facility]) 02/09/2018   Essential hypertension 02/09/2018   Abnormal uterine bleeding 02/09/2018   Current every day smoker 02/09/2018    Past Surgical History:  Procedure Laterality Date   APPENDECTOMY     COLON SURGERY     KNEE SURGERY     MASS EXCISION Right 08/15/2018   Procedure: EXCISION RIGHT FEMORAL ANTERIOR   MASS;  Surgeon: Axel Filler, MD;  Location: [facility];  Service: General;  Laterality: Right;    OB History     Gravida  9   Para  2   Term  1   Preterm  1   AB  7   Living  1      SAB  5   IAB  2   Ectopic      Multiple      Live Births  1            Home Medications    Prior to Admission medications   Medication Sig Start Date End Date Taking? Authorizing Provider  silver sulfADIAZINE (SILVADENE) 1 % cream Apply 1 Application topically daily. 07/24/23  Yes Ward, Tylene Fantasia, PA-C  acetaminophen (TYLENOL) 650 MG CR tablet Take 650 mg by mouth every 8 (eight) hours as needed for pain.    [provider]  Cholecalciferol (DIALYVITE VITAMIN D 5000) 125  MCG (5000 UT) capsule Take 5,000 Units by mouth daily.    [provider]  docusate sodium (COLACE) 100 MG capsule Take 100 mg by mouth daily.    [provider]  folic acid (FOLVITE) 800 MCG tablet Take 400 mcg by mouth daily. Patient not taking: Reported on 07/24/2023    [provider]  GLUCOSAMINE-CHONDROITIN PO Take 1 capsule by mouth daily.    [provider]  hydrOXYzine (ATARAX) 25 MG tablet Take 1 tablet (25 mg total) by mouth every 6 (six) hours. 01/20/23   Carlisle Beers, FNP  meloxicam (MOBIC) 15 MG tablet Take 15 mg by mouth daily. Patient not taking: Reported on 07/24/2023 12/31/22   [provider]  naproxen (NAPROSYN) 500 MG tablet Take 1 tablet (500 mg total) by mouth 2 (two) times daily with a meal. Patient not taking: Reported on 07/24/2023 01/18/21   Garlon Hatchet, PA-C  omeprazole (PRILOSEC) 20 MG capsule Take 20 mg by mouth daily. Patient not taking: Reported on 07/24/2023 12/31/22  [provider]  oxybutynin (DITROPAN) 5 MG tablet Take 5 mg by mouth daily. Patient not taking: Reported on 07/24/2023 12/31/22   [provider]  penicillin v potassium (VEETID) 500 MG tablet Take 1 tablet (500 mg total) by mouth 4 (four) times daily. Patient not taking: Reported on 07/24/2023 01/18/21   Garlon Hatchet, PA-C  phentermine (ADIPEX-P) 37.5 MG tablet Take 37.5 mg by mouth every morning. 12/31/22   [provider]  Vitamin A 2400 MCG (8000 UT) CAPS Take 2,400 mcg by mouth daily.    [provider]  vitamin B-12 (CYANOCOBALAMIN) 1000 MCG tablet Take 1,000 mcg by mouth daily. Patient not taking: Reported on 07/24/2023    [provider]    Family History History reviewed. No pertinent family history.  Social History Social History   Tobacco Use   Smoking status: Every Day    Current packs/day: 0.50    Types: Cigarettes   Smokeless tobacco: Never  Vaping Use   Vaping status: Never Used   Substance Use Topics   Alcohol use: Yes   Drug use: No     Allergies   Bactrim [sulfamethoxazole-trimethoprim]   Review of Systems Review of Systems  Constitutional:  Negative for chills and fever.  HENT:  Negative for ear pain and sore throat.   Eyes:  Negative for pain and visual disturbance.  Respiratory:  Negative for cough and shortness of breath.   Cardiovascular:  Negative for chest pain and palpitations.  Gastrointestinal:  Negative for abdominal pain and vomiting.  Genitourinary:  Negative for dysuria and hematuria.  Musculoskeletal:  Negative for arthralgias and back pain.  Skin:  Positive for wound. Negative for color change and rash.  Neurological:  Negative for seizures and syncope.  All other systems reviewed and are negative.    Physical Exam Triage Vital Signs ED Triage Vitals  Encounter Vitals Group     BP 07/24/23 1752 130/86     Systolic BP Percentile --      Diastolic BP Percentile --      Pulse Rate 07/24/23 1752 80     Resp 07/24/23 1752 20     Temp 07/24/23 1752 98.7 F (37.1 C)     Temp Source 07/24/23 1752 Oral     SpO2 07/24/23 1752 97 %     Weight --      Height --      Head Circumference --      Peak Flow --      Pain Score 07/24/23 1747 7     Pain Loc --      Pain Education --      Exclude from Growth Chart --    No data found.  Updated Vital Signs BP 130/86   Pulse 80   Temp 98.7 F (37.1 C) (Oral)   Resp 20   LMP 04/26/2018   SpO2 97%   Visual Acuity Right Eye Distance:   Left Eye Distance:   Bilateral Distance:    Right Eye Near:   Left Eye Near:    Bilateral Near:     Physical Exam Vitals and nursing note reviewed.  Constitutional:      General: She is not in acute distress.    Appearance: She is well-developed.  HENT:     Head: Normocephalic and atraumatic.  Eyes:     Conjunctiva/sclera: Conjunctivae normal.  Cardiovascular:     Rate and Rhythm: Normal rate and regular rhythm.     Heart sounds: No  murmur heard. Pulmonary:     Effort: Pulmonary effort is normal. No respiratory distress.     Breath sounds: Normal breath sounds.  Abdominal:     Palpations: Abdomen is soft.     Tenderness: There is no abdominal tenderness.  Musculoskeletal:        General: No swelling.     Cervical back: Neck supple.  Skin:    General: Skin is warm and dry.     Capillary Refill: Capillary refill takes less than 2 seconds.     Comments: Approximately 4 cm linear burn, partial thickness to posterior left arm. More superficial first-degree burn to left upper back with area of small blisters.  Neurological:     Mental Status: She is alert.  Psychiatric:        Mood and Affect: Mood normal.      UC Treatments / Results  Labs (all labs ordered are listed, but only abnormal results are displayed) Labs Reviewed - No data to display  EKG   Radiology No results found.  Procedures Procedures (including critical care time)  Medications Ordered in UC Medications - No data to display  Initial Impression / Assessment and Plan / UC Course  I have reviewed the triage vital signs and the nursing notes.  Pertinent labs & imaging results that were available during my care of the patient were reviewed by me and considered in my medical decision making (see chart for details).     Burn to posterior left arm and left upper back.  Lower arm partial-thickness, second-degree burn.  Left upper back superficial first-degree burn with small blistering.  Wounds cleaned and dressed in clinic today.  Silvadene prescribed.  Supportive care discussed.  Return precautions discussed.  Patient overall well-appearing in no acute distress. Final Clinical Impressions(s) / UC Diagnoses   Final diagnoses:  Burn     Discharge Instructions      Can wash area with soap and water, apply silvadene and keep covered Return if symptoms become worse.    ED Prescriptions     Medication Sig Dispense Auth. Provider    silver sulfADIAZINE (SILVADENE) 1 % cream Apply 1 Application topically daily. 50 g Ward, Tylene Fantasia, PA-C      PDMP not reviewed this encounter.   Ward, Tylene Fantasia, PA-C 07/24/23 1825

## 2023-07-24 NOTE — ED Triage Notes (Signed)
Patient fell in her kitchen today approximately 3:00 pm today.  Her left arm and upper back came in contact with a hot surface.  Left arm at elbow has a streak where skin is pealed away.  Left upper arm, back of arm os red and warm to touch.  Left upper back with burn and small area of blisters.    Has not had any medicines for symptoms.

## 2023-07-24 NOTE — Discharge Instructions (Addendum)
Can wash area with soap and water, apply silvadene and keep covered Return if symptoms become worse.

## 2023-08-02 ENCOUNTER — Ambulatory Visit (HOSPITAL_COMMUNITY)
Admission: EM | Admit: 2023-08-02 | Discharge: 2023-08-02 | Disposition: A | Discharge status: Home / Self Care | Payer: 59 | Attending: Emergency Medicine | Admitting: Emergency Medicine

## 2023-08-02 ENCOUNTER — Encounter (HOSPITAL_COMMUNITY): Payer: Self-pay

## 2023-08-02 DIAGNOSIS — U071 COVID-19: Secondary | ICD-10-CM | POA: Insufficient documentation

## 2023-08-02 DIAGNOSIS — B349 Viral infection, unspecified: Secondary | ICD-10-CM | POA: Diagnosis present

## 2023-08-02 DIAGNOSIS — R52 Pain, unspecified: Secondary | ICD-10-CM | POA: Insufficient documentation

## 2023-08-02 LAB — POCT INFLUENZA A/B
Influenza A, POC: NEGATIVE
Influenza B, POC: NEGATIVE

## 2023-08-02 MED ORDER — ONDANSETRON 4 MG PO TBDP: 4.0000 mg | ORAL_TABLET | Freq: Four times a day (QID) | ORAL | 0 refills | Status: DC | PRN

## 2023-08-02 MED ORDER — CYCLOBENZAPRINE HCL 10 MG PO TABS: 10.0000 mg | ORAL_TABLET | Freq: Two times a day (BID) | ORAL | 0 refills | Status: DC | PRN

## 2023-08-02 MED ORDER — ONDANSETRON 4 MG PO TBDP
4.0000 mg | ORAL_TABLET | Freq: Once | ORAL | Status: AC
  Administered 2023-08-02: 4 mg via ORAL

## 2023-08-02 MED ORDER — ONDANSETRON 4 MG PO TBDP
ORAL_TABLET | ORAL | Status: AC
  Filled 2023-08-02: qty 1

## 2023-08-02 MED ORDER — ACETAMINOPHEN 325 MG PO TABS
975.0000 mg | ORAL_TABLET | Freq: Once | ORAL | Status: AC
  Administered 2023-08-02: 975 mg via ORAL

## 2023-08-02 MED ORDER — ACETAMINOPHEN 325 MG PO TABS
ORAL_TABLET | ORAL | Status: AC
  Filled 2023-08-02: qty 3

## 2023-08-02 NOTE — ED Triage Notes (Signed)
Patient reports that she has had chills, generalized body aches,, left lower back pain since yesterday.  Patient states she took plain Catering manager tabs at 0100 today.

## 2023-08-02 NOTE — ED Provider Notes (Signed)
[facility]    CSN: [id number] Arrival date & time: 08/02/23  1352     History   Chief Complaint Chief Complaint  Patient presents with   Chills   Generalized Body Aches   Headache    HPI Betty Perez is a 47 y.o. female.  Yesterday developed subjective fever, chills, body aches Also yesterday had vomiting and soft stools. Today she is able to tolerate fluids but has no appetite. No abdominal pain. No urinary symptoms. 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   History of blood transfusion    Hypertension    Lipoma    Obesity    PTSD (post-traumatic stress disorder)     Patient Active Problem List   Diagnosis Date Noted   Morbid obesity (HCC) 02/09/2018   Essential hypertension 02/09/2018   Abnormal uterine bleeding 02/09/2018   Current every day smoker 02/09/2018    Past Surgical History:  Procedure Laterality Date   APPENDECTOMY     COLON SURGERY     KNEE SURGERY     MASS EXCISION Right 08/15/2018   Procedure: EXCISION RIGHT FEMORAL ANTERIOR   MASS;  Surgeon: Axel Filler, MD;  Location: [facility];  Service: General;  Laterality: Right;    OB History     Gravida  9   Para  2   Term  1   Preterm  1   AB  7   Living  1      SAB  5   IAB  2   Ectopic      Multiple      Live Births  1            Home Medications    Prior to Admission medications   Medication Sig Start Date End Date Taking? Authorizing Provider  cyclobenzaprine (FLEXERIL) 10 MG tablet Take 1 tablet (10 mg total) by mouth 2 (two) times daily as needed for muscle spasms. 08/02/23  Yes Adren Dollins, PA-C  ondansetron (ZOFRAN-ODT) 4  MG disintegrating tablet Take 1 tablet (4 mg total) by mouth every 6 (six) hours as needed for nausea or vomiting. 08/02/23  Yes Amarilys Lyles, Lurena Joiner, PA-C  acetaminophen (TYLENOL) 650 MG CR tablet Take 650 mg by mouth every 8 (eight) hours as needed for pain.    [provider]  docusate sodium (COLACE) 100 MG capsule Take 100 mg by mouth daily.    [provider]    Family History History reviewed. No pertinent family history.  Social History Social History   Tobacco Use   Smoking status: Every Day    Current packs/day: 0.50    Types: Cigarettes   Smokeless tobacco: Never  Vaping Use   Vaping status: Never Used  Substance Use Topics   Alcohol use: Yes   Drug use: No     Allergies   Bactrim [sulfamethoxazole-trimethoprim]   Review of Systems Review of Systems As per HPI  Physical Exam Triage Vital Signs ED Triage Vitals  Encounter Vitals Group     BP 08/02/23 1507 (!) 142/97     Systolic BP Percentile --      Diastolic  BP Percentile --      Pulse Rate 08/02/23 1507 (!) 103     Resp 08/02/23 1507 16     Temp 08/02/23 1507 99.3 F (37.4 C)     Temp Source 08/02/23 1507 Oral     SpO2 08/02/23 1507 96 %     Weight --      Height --      Head Circumference --      Peak Flow --      Pain Score 08/02/23 1508 10     Pain Loc --      Pain Education --      Exclude from Growth Chart --    No data found.  Updated Vital Signs BP 135/83 (BP Location: Left Arm)   Pulse 95   Temp 99.1 F (37.3 C) (Oral)   Resp 16   LMP 04/26/2018   SpO2 96%   Physical Exam Vitals and nursing note reviewed.  Constitutional:      General: She is not in acute distress.    Appearance: She is not ill-appearing.  HENT:     Right Ear: Tympanic membrane and ear canal normal.     Left Ear: Tympanic membrane and ear canal normal.     Nose: No congestion or rhinorrhea.     Mouth/Throat:     Mouth: Mucous membranes are moist.     Pharynx: Oropharynx is clear. No posterior  oropharyngeal erythema.  Eyes:     Conjunctiva/sclera: Conjunctivae normal.  Cardiovascular:     Rate and Rhythm: Normal rate and regular rhythm.     Heart sounds: Normal heart sounds.  Pulmonary:     Effort: Pulmonary effort is normal.     Breath sounds: Normal breath sounds.  Abdominal:     Palpations: Abdomen is soft.     Tenderness: There is no abdominal tenderness.  Musculoskeletal:        General: Normal range of motion.     Cervical back: Normal range of motion. No rigidity.  Lymphadenopathy:     Cervical: No cervical adenopathy.  Skin:    General: Skin is warm and dry.  Neurological:     Mental Status: She is alert and oriented to person, place, and time.     UC Treatments / Results  Labs (all labs ordered are listed, but only abnormal results are displayed) Labs Reviewed  SARS CORONAVIRUS 2 (TAT 6-24 HRS)  POCT INFLUENZA A/B    EKG  Radiology No results found.  Procedures Procedures  Medications Ordered in UC Medications  acetaminophen (TYLENOL) tablet 975 mg (975 mg Oral Given 08/02/23 1556)  ondansetron (ZOFRAN-ODT) disintegrating tablet 4 mg (4 mg Oral Given 08/02/23 1556)    Initial Impression / Assessment and Plan / UC Course  I have reviewed the triage vital signs and the nursing notes.  Pertinent labs & imaging results that were available during my care of the patient were reviewed by me and considered in my medical decision making (see chart for details).  Temp on arrival 99.3, tachycardic 103 Rapid flu A/B negative  Covid test pending.  Tylenol dose and zofran ODT given in clinic. Patient improved. Discussed viral etiology and symptomatic care at home.  Recommend ibuprofen and Tylenol for body aches/fever, can try Flexeril twice daily.  Discussed drowsy precautions.  Also sent Zofran to use q6 hours prn, increase fluids, bland diet Return and ED precautions discussed Patient agreeable to plan, no questions at this time  Final Clinical  Impressions(s) /  UC Diagnoses   Final diagnoses:  Viral illness  Generalized body aches     Discharge Instructions      Flu test today is negative   We will call you if your covid test returns positive.  I recommend to alternate Tylenol and ibuprofen for your body aches and fever. The muscle relaxer may help with bodyaches as well.  You can take twice daily; if this makes you drowsy then take only at bedtime Drink lots of fluids!     ED Prescriptions     Medication Sig Dispense Auth. Provider   cyclobenzaprine (FLEXERIL) 10 MG tablet Take 1 tablet (10 mg total) by mouth 2 (two) times daily as needed for muscle spasms. 20 tablet Aahil Fredin, PA-C   ondansetron (ZOFRAN-ODT) 4 MG disintegrating tablet Take 1 tablet (4 mg total) by mouth every 6 (six) hours as needed for nausea or vomiting. 20 tablet Aloha Bartok, Lurena Joiner, PA-C      PDMP not reviewed this encounter.   Marlow Baars, [place] 08/02/23 1736

## 2023-08-02 NOTE — Discharge Instructions (Addendum)
Flu test today is negative   We will call you if your covid test returns positive.  I recommend to alternate Tylenol and ibuprofen for your body aches and fever. The muscle relaxer may help with bodyaches as well.  You can take twice daily; if this makes you drowsy then take only at bedtime Drink lots of fluids!

## 2023-08-03 LAB — SARS CORONAVIRUS 2 (TAT 6-24 HRS): SARS Coronavirus 2: POSITIVE — AB

## 2024-08-10 ENCOUNTER — Emergency Department (HOSPITAL_COMMUNITY)
Admission: EM | Admit: 2024-08-10 | Discharge: 2024-08-10 | Discharge status: Against Medical Advice | Attending: Emergency Medicine | Admitting: Emergency Medicine

## 2024-08-10 ENCOUNTER — Encounter (HOSPITAL_COMMUNITY): Payer: Self-pay

## 2024-08-10 ENCOUNTER — Ambulatory Visit (HOSPITAL_COMMUNITY): Admission: EM | Admit: 2024-08-10 | Discharge: 2024-08-10 | Disposition: A | Discharge status: Home / Self Care

## 2024-08-10 DIAGNOSIS — R519 Headache, unspecified: Secondary | ICD-10-CM | POA: Insufficient documentation

## 2024-08-10 DIAGNOSIS — Z5321 Procedure and treatment not carried out due to patient leaving prior to being seen by health care provider: Secondary | ICD-10-CM | POA: Insufficient documentation

## 2024-08-10 DIAGNOSIS — H579 Unspecified disorder of eye and adnexa: Secondary | ICD-10-CM | POA: Diagnosis not present

## 2024-08-10 DIAGNOSIS — S0990XA Unspecified injury of head, initial encounter: Secondary | ICD-10-CM | POA: Diagnosis not present

## 2024-08-10 DIAGNOSIS — H1133 Conjunctival hemorrhage, bilateral: Secondary | ICD-10-CM | POA: Diagnosis not present
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
## 2024-08-10 NOTE — ED Notes (Signed)
 Patient is being discharged from the Urgent Care and sent to the Emergency Department via private vehicle . Per provider, patient is in need of higher level of care due to head injury. Patient is aware and verbalizes understanding of plan of care.  Vitals:   08/10/24 1923  BP: 132/88  Pulse: (!) 103  Resp: 16  Temp: 98.9 F (37.2 C)  SpO2: 94%

## 2024-08-10 NOTE — ED Notes (Signed)
 Pt refused vitals.  Stated she is waiting for her sister to come pick her up.
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
## 2024-08-11 ENCOUNTER — Emergency Department (HOSPITAL_BASED_OUTPATIENT_CLINIC_OR_DEPARTMENT_OTHER)
Admission: EM | Admit: 2024-08-11 | Discharge: 2024-08-11 | Disposition: A | Discharge status: Home / Self Care | Attending: Emergency Medicine | Admitting: Emergency Medicine

## 2024-08-11 ENCOUNTER — Encounter (HOSPITAL_BASED_OUTPATIENT_CLINIC_OR_DEPARTMENT_OTHER): Payer: Self-pay | Admitting: Emergency Medicine

## 2024-08-11 ENCOUNTER — Emergency Department (HOSPITAL_BASED_OUTPATIENT_CLINIC_OR_DEPARTMENT_OTHER)

## 2024-08-11 DIAGNOSIS — H209 Unspecified iridocyclitis: Secondary | ICD-10-CM | POA: Insufficient documentation

## 2024-08-11 DIAGNOSIS — H5712 Ocular pain, left eye: Secondary | ICD-10-CM | POA: Diagnosis present

## 2024-08-11 DIAGNOSIS — H538 Other visual disturbances: Secondary | ICD-10-CM | POA: Insufficient documentation

## 2024-08-11 MED ORDER — TETRACAINE HCL 0.5 % OP SOLN
2.0000 [drp] | Freq: Once | OPHTHALMIC | Status: AC
  Administered 2024-08-11: 2 [drp] via OPHTHALMIC
  Filled 2024-08-11: qty 4

## 2024-08-11 MED ORDER — ACETAMINOPHEN 500 MG PO TABS
1000.0000 mg | ORAL_TABLET | Freq: Once | ORAL | Status: AC
  Administered 2024-08-11: 1000 mg via ORAL
  Filled 2024-08-11: qty 2

## 2024-08-11 MED ORDER — FLUORESCEIN SODIUM 1 MG OP STRP
1.0000 | ORAL_STRIP | Freq: Once | OPHTHALMIC | Status: AC
  Administered 2024-08-11: 1 via OPHTHALMIC
  Filled 2024-08-11: qty 1

## 2024-08-11 MED ORDER — TRAMADOL HCL 50 MG PO TABS: 50.0000 mg | ORAL_TABLET | Freq: Two times a day (BID) | ORAL | 0 refills | Status: DC | PRN

## 2024-08-11 MED ORDER — AMOXICILLIN-POT CLAVULANATE 875-125 MG PO TABS: 1.0000 | ORAL_TABLET | Freq: Two times a day (BID) | ORAL | 0 refills | Status: DC

## 2024-08-11 MED ORDER — CYCLOPENTOLATE HCL 1 % OP SOLN: 1.0000 [drp] | Freq: Every evening | OPHTHALMIC | 0 refills | Status: DC

## 2024-08-11 MED ORDER — PREDNISOLONE ACETATE 1 % OP SUSP: 1.0000 [drp] | Freq: Every day | OPHTHALMIC | 0 refills | Status: DC
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
## 2024-08-11 NOTE — ED Notes (Addendum)
 Spoke with [place] at [facility] for ophthalmology consult

## 2024-08-11 NOTE — ED Notes (Signed)
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
## 2024-10-24 ENCOUNTER — Ambulatory Visit (HOSPITAL_COMMUNITY)
Admission: EM | Admit: 2024-10-24 | Discharge: 2024-10-24 | Disposition: A | Discharge status: Home / Self Care | Attending: Family Medicine | Admitting: Family Medicine

## 2024-10-24 ENCOUNTER — Encounter (HOSPITAL_COMMUNITY): Payer: Self-pay | Admitting: Family Medicine

## 2024-10-24 DIAGNOSIS — S025XXA Fracture of tooth (traumatic), initial encounter for closed fracture: Secondary | ICD-10-CM

## 2024-10-24 DIAGNOSIS — H1032 Unspecified acute conjunctivitis, left eye: Secondary | ICD-10-CM | POA: Diagnosis not present

## 2024-10-24 DIAGNOSIS — K047 Periapical abscess without sinus: Secondary | ICD-10-CM | POA: Diagnosis not present

## 2024-10-24 MED ORDER — FLUORESCEIN SODIUM 1 MG OP STRP
ORAL_STRIP | OPHTHALMIC | Status: AC
  Filled 2024-10-24: qty 1

## 2024-10-24 MED ORDER — AMOXICILLIN-POT CLAVULANATE 875-125 MG PO TABS: 1.0000 | ORAL_TABLET | Freq: Two times a day (BID) | ORAL | 0 refills | Status: AC

## 2024-10-24 MED ORDER — MOXIFLOXACIN HCL 0.5 % OP SOLN: 1.0000 [drp] | Freq: Three times a day (TID) | OPHTHALMIC | 0 refills | Status: AC

## 2024-10-24 MED ORDER — TETRACAINE HCL 0.5 % OP SOLN
OPHTHALMIC | Status: AC
  Filled 2024-10-24: qty 4

## 2024-10-24 NOTE — ED Triage Notes (Signed)
 Patient states she has dental abscesses on the left upper and left lower gums. Patient states Sometimes they pop o their own and othre times I pop them. Patient states she has intermittent left facial swelling as well.  Yesterday, patient states she developed left eye redness and irritation. Patient states she has clear to a watery white drainage. Patient reports light sensitivity.

## 2024-10-24 NOTE — Discharge Instructions (Addendum)
 Broken teeth with mild dental abscess on the left upper jaw and left lower jaw: Augmentin  875 mg, 1 pill twice daily for 10 days.  Take the medication after food.  Encouraged probiotics such as eating yogurt or probiotic pills to prevent antibiotic related diarrhea.  Patient has multiple broken teeth.  Strongly encouraged to see a dentist and have these broken teeth removed.  These infections will not stop until she gets her dental problems resolved.  Conjunctivitis left eye: Moxifloxacin  ophthalmic drops, 1 drop into the left eye 3 times daily for 5 days.  If any symptoms persist at day 5 use for 7 to 8 days.  Call compresses are helpful to reduce pain and swelling.  Follow-up if symptoms do not improve, worsen or new symptoms.

## 2024-10-24 NOTE — ED Provider Notes (Signed)
 [facility]    CSN: [id number] Arrival date & time: 10/24/24  1848      History   Chief Complaint Chief Complaint  Patient presents with   Facial Swelling    HPI Airlie Blumenberg gwenette Koelzer is a 48 y.o. female.   Patient seen on 08/11/2024 at the [facility] for traumatic iritis and also dental abscess due to broken teeth.  She was put on Augmentin  875 mg twice daily for a week for the dental abscess.  She reports that she has multiple broken teeth and has not seen a dentist in years.  She has intermittent dental abscesses.  She will have swelling with pockets of pus and sometimes able rupture and resolve on their own.  Sometimes she will poke them with a needle to get them to drain.  She has noticed that she has had left upper and left lower jaw swelling in the last week (since approximately 10/18/2024 or earlier).  She developed swelling and redness of her left eye on 10/23/2024.  She does not know if she has a infection or if the swelling is secondary to facial swelling from a dental abscess.  Her eye is red and irritated and hurts.  Today is having a watery discharge and was matted shut a few times.  She is having mild sensitivity to light in the left eye.  She had traumatic eye injury in September 2025 but is not aware of any recent injury to her eye.     Past Medical History:  Diagnosis Date   Anemia    d/t miscariage   Bronchitis    Complication of anesthesia    during appy reports that CPR had to be performed on her.   History of blood transfusion    Hypertension    Lipoma    Obesity    PTSD (post-traumatic stress disorder)     Patient Active Problem List   Diagnosis Date Noted   Morbid obesity (HCC) 02/09/2018   Essential hypertension 02/09/2018   Abnormal uterine bleeding 02/09/2018   Current every day smoker 02/09/2018    Past Surgical History:  Procedure Laterality Date   APPENDECTOMY     COLON SURGERY     KNEE SURGERY     MASS  EXCISION Right 08/15/2018   Procedure: EXCISION RIGHT FEMORAL ANTERIOR   MASS;  Surgeon: Rubin Calamity, MD;  Location: [facility] OR;  Service: General;  Laterality: Right;    OB History     Gravida  9   Para  2   Term  1   Preterm  1   AB  7   Living  1      SAB  5   IAB  2   Ectopic      Multiple      Live Births  1            Home Medications    Prior to Admission medications   Medication Sig Start Date End Date Taking? Authorizing Provider  amoxicillin -clavulanate (AUGMENTIN ) 875-125 MG tablet Take 1 tablet by mouth 2 (two) times daily after a meal for 10 days. 10/24/24 11/03/24 Yes Ival Domino, FNP  moxifloxacin  (VIGAMOX ) 0.5 % ophthalmic solution Place 1 drop into the left eye 3 (three) times daily. 10/24/24  Yes Ival Domino, FNP  acetaminophen  (TYLENOL ) 650 MG CR tablet Take 650 mg by mouth every 8 (eight) hours as needed for pain.    [provider]    Family History History  reviewed. No pertinent family history.  Social History Social History   Tobacco Use   Smoking status: Every Day    Current packs/day: 0.50    Types: Cigarettes   Smokeless tobacco: Never  Vaping Use   Vaping status: Never Used  Substance Use Topics   Alcohol use: Yes   Drug use: No     Allergies   Bactrim  [sulfamethoxazole -trimethoprim ]   Review of Systems Review of Systems  Constitutional:  Negative for chills and fever.  HENT:  Positive for dental problem (Probable abscess of left upper and lower jaw due to broken teeth) and facial swelling (Left cheek above left jaw). Negative for ear pain and sore throat.   Eyes:  Positive for pain, discharge and redness. Negative for visual disturbance.  Respiratory:  Negative for cough and shortness of breath.   Cardiovascular:  Negative for chest pain and palpitations.  Gastrointestinal:  Negative for abdominal pain, constipation, diarrhea, nausea and vomiting.  Genitourinary:  Negative for dysuria and hematuria.   Musculoskeletal:  Negative for arthralgias and back pain.  Skin:  Negative for color change and rash.  Neurological:  Negative for seizures and syncope.  All other systems reviewed and are negative.    Physical Exam Triage Vital Signs ED Triage Vitals [10/24/24 1912]  Encounter Vitals Group     BP (!) 145/87     Girls Systolic BP Percentile      Girls Diastolic BP Percentile      Boys Systolic BP Percentile      Boys Diastolic BP Percentile      Pulse Rate 91     Resp 16     Temp 97.9 F (36.6 C)     Temp Source Oral     SpO2 95 %     Weight      Height      Head Circumference      Peak Flow      Pain Score 10     Pain Loc      Pain Education      Exclude from Growth Chart    No data found.  Updated Vital Signs BP (!) 145/87 (BP Location: Right Arm)   Pulse 91   Temp 97.9 F (36.6 C) (Oral)   Resp 16   LMP 04/26/2018   SpO2 95%   Visual Acuity Right Eye Distance:   Left Eye Distance:   Bilateral Distance:    Right Eye Near:   Left Eye Near:    Bilateral Near:     Physical Exam Vitals and nursing note reviewed.  Constitutional:      General: She is not in acute distress.    Appearance: She is well-developed. She is ill-appearing. She is not toxic-appearing or diaphoretic.  HENT:     Head: Normocephalic and atraumatic.     Right Ear: Hearing, tympanic membrane, ear canal and external ear normal.     Left Ear: Hearing, tympanic membrane, ear canal and external ear normal.     Nose: No congestion or rhinorrhea.     Right Sinus: No maxillary sinus tenderness or frontal sinus tenderness.     Left Sinus: No maxillary sinus tenderness or frontal sinus tenderness.     Mouth/Throat:     Lips: Pink.     Mouth: Mucous membranes are moist.     Dentition: Abnormal dentition (Multiple broken teeth on the left and right upper and lower jaws.). Dental tenderness, gingival swelling, dental caries and dental abscesses (Jaw and left  lower jaw.) present.     Pharynx:  Uvula midline. No oropharyngeal exudate or posterior oropharyngeal erythema.     Tonsils: No tonsillar exudate.     Comments: Left cheek is swollen and tender above the left upper jaw. Eyes:     General:        Right eye: No foreign body, discharge or hordeolum.        Left eye: Discharge [facility] discharge) present.No foreign body or hordeolum.     Extraocular Movements:     Right eye: Normal extraocular motion and no nystagmus.     Left eye: Normal extraocular motion and no nystagmus.     Conjunctiva/sclera:     Right eye: Right conjunctiva is not injected. No chemosis, exudate or hemorrhage.    Left eye: Left conjunctiva is injected. Exudate (Watery discharge) present. No chemosis or hemorrhage.    Pupils: Pupils are equal, round, and reactive to light.     Right eye: Pupil is round, reactive and not sluggish.     Left eye: Pupil is round, reactive and not sluggish. No corneal abrasion or fluorescein  uptake.  Cardiovascular:     Rate and Rhythm: Normal rate and regular rhythm.     Heart sounds: S1 normal and S2 normal. No murmur heard. Pulmonary:     Effort: Pulmonary effort is normal. No respiratory distress.     Breath sounds: Normal breath sounds. No decreased breath sounds, wheezing, rhonchi or rales.  Abdominal:     General: Bowel sounds are normal.     Palpations: Abdomen is soft.     Tenderness: There is no abdominal tenderness.  Musculoskeletal:        General: No swelling.     Cervical back: Neck supple.  Lymphadenopathy:     Head:     Right side of head: No submental, submandibular, tonsillar, preauricular or posterior auricular adenopathy.     Left side of head: No submental, submandibular, tonsillar, preauricular or posterior auricular adenopathy.     Cervical: No cervical adenopathy.     Right cervical: No superficial cervical adenopathy.    Left cervical: No superficial cervical adenopathy.  Skin:    General: Skin is warm and dry.     Capillary Refill: Capillary  refill takes less than 2 seconds.     Findings: No rash.  Neurological:     Mental Status: She is alert and oriented to person, place, and time.  Psychiatric:        Mood and Affect: Mood normal.      UC Treatments / Results  Labs (all labs ordered are listed, but only abnormal results are displayed) Labs Reviewed - No data to display  EKG   Radiology No results found.  Procedures Procedures (including critical care time)  Medications Ordered in UC Medications - No data to display  Initial Impression / Assessment and Plan / UC Course  I have reviewed the triage vital signs and the nursing notes.  Pertinent labs & imaging results that were available during my care of the patient were reviewed by me and considered in my medical decision making (see chart for details).  Plan of Care: Broken teeth with mild dental abscess on the left upper jaw and left lower jaw: Augmentin  875 mg, 1 pill twice daily for 10 days.  Take the medication after food.  Encouraged probiotics such as eating yogurt or probiotic pills to prevent antibiotic related diarrhea.  Patient has multiple broken teeth.  Strongly encouraged to see a  dentist and have these broken teeth removed.  These infections will not stop until she gets her dental problems resolved.  Conjunctivitis left eye: Moxifloxacin  ophthalmic drops, 1 drop into the left eye 3 times daily for 5 days.  If any symptoms persist at day 5 use for 7 to 8 days.  Call compresses are helpful to reduce pain and swelling.  Follow-up if symptoms do not improve, worsen or new symptoms.  I reviewed the plan of care with the patient and/or the patient's guardian.  The patient and/or guardian had time to ask questions and acknowledged that the questions were answered.  I provided instruction on symptoms or reasons to return here or to go to an ER, if symptoms/condition did not improve, worsened or if new symptoms occurred.  Final Clinical Impressions(s) / UC  Diagnoses   Final diagnoses:  Dental abscess  Acute conjunctivitis of left eye, unspecified acute conjunctivitis type  Broken teeth     Discharge Instructions      Broken teeth with mild dental abscess on the left upper jaw and left lower jaw: Augmentin  875 mg, 1 pill twice daily for 10 days.  Take the medication after food.  Encouraged probiotics such as eating yogurt or probiotic pills to prevent antibiotic related diarrhea.  Patient has multiple broken teeth.  Strongly encouraged to see a dentist and have these broken teeth removed.  These infections will not stop until she gets her dental problems resolved.  Conjunctivitis left eye: Moxifloxacin  ophthalmic drops, 1 drop into the left eye 3 times daily for 5 days.  If any symptoms persist at day 5 use for 7 to 8 days.  Call compresses are helpful to reduce pain and swelling.  Follow-up if symptoms do not improve, worsen or new symptoms.     ED Prescriptions     Medication Sig Dispense Auth. Provider   amoxicillin -clavulanate (AUGMENTIN ) 875-125 MG tablet Take 1 tablet by mouth 2 (two) times daily after a meal for 10 days. 20 tablet Ariah Mower, FNP   moxifloxacin  (VIGAMOX ) 0.5 % ophthalmic solution Place 1 drop into the left eye 3 (three) times daily. 3 mL Ival Domino, FNP      PDMP not reviewed this encounter.   Ival Domino, FNP 10/24/24 1950
